# Patient Record
Sex: Male | Born: 1935 | Race: White | Hispanic: No | Smoking: Former smoker
Health system: Southern US, Community
[De-identification: ages and names within clinical notes are randomized; demographics above are authoritative.]

## PROBLEM LIST (undated history)

## (undated) DIAGNOSIS — C8590 Non-Hodgkin lymphoma, unspecified, unspecified site: Secondary | ICD-10-CM

## (undated) DIAGNOSIS — N400 Enlarged prostate without lower urinary tract symptoms: Secondary | ICD-10-CM

## (undated) DIAGNOSIS — M1990 Unspecified osteoarthritis, unspecified site: Secondary | ICD-10-CM

## (undated) DIAGNOSIS — H9190 Unspecified hearing loss, unspecified ear: Secondary | ICD-10-CM

## (undated) DIAGNOSIS — E785 Hyperlipidemia, unspecified: Secondary | ICD-10-CM

## (undated) DIAGNOSIS — G2581 Restless legs syndrome: Secondary | ICD-10-CM

## (undated) HISTORY — PX: EYE SURGERY: SHX253

## (undated) HISTORY — DX: Unspecified osteoarthritis, unspecified site: M19.90

## (undated) HISTORY — DX: Restless legs syndrome: G25.81

## (undated) HISTORY — DX: Non-Hodgkin lymphoma, unspecified, unspecified site: C85.90

## (undated) HISTORY — DX: Hyperlipidemia, unspecified: E78.5

---

## 1961-11-12 HISTORY — PX: HEMORRHOID SURGERY: SHX153

## 2000-11-12 HISTORY — PX: TRIGGER FINGER RELEASE: SHX641

## 2001-11-12 HISTORY — PX: ELBOW SURGERY: SHX618

## 2003-02-09 ENCOUNTER — Ambulatory Visit (HOSPITAL_COMMUNITY): Admission: RE | Admit: 2003-02-09 | Discharge: 2003-02-09 | Payer: Self-pay | Admitting: Pulmonary Disease

## 2003-07-15 ENCOUNTER — Ambulatory Visit (HOSPITAL_COMMUNITY): Admission: RE | Admit: 2003-07-15 | Discharge: 2003-07-15 | Payer: Self-pay | Admitting: Internal Medicine

## 2005-12-12 ENCOUNTER — Other Ambulatory Visit: Admission: RE | Admit: 2005-12-12 | Discharge: 2005-12-12 | Payer: Self-pay | Admitting: Orthopaedic Surgery

## 2006-09-13 ENCOUNTER — Ambulatory Visit: Payer: Self-pay | Admitting: Internal Medicine

## 2006-09-26 ENCOUNTER — Encounter (INDEPENDENT_AMBULATORY_CARE_PROVIDER_SITE_OTHER): Payer: Self-pay | Admitting: *Deleted

## 2006-09-26 ENCOUNTER — Ambulatory Visit: Payer: Self-pay | Admitting: Internal Medicine

## 2009-12-21 ENCOUNTER — Ambulatory Visit (HOSPITAL_COMMUNITY): Admission: RE | Admit: 2009-12-21 | Discharge: 2009-12-21 | Payer: Self-pay | Admitting: Pulmonary Disease

## 2009-12-23 ENCOUNTER — Ambulatory Visit (HOSPITAL_COMMUNITY): Admission: RE | Admit: 2009-12-23 | Discharge: 2009-12-23 | Payer: Self-pay | Admitting: Pulmonary Disease

## 2010-03-29 ENCOUNTER — Ambulatory Visit: Admission: RE | Admit: 2010-03-29 | Discharge: 2010-06-27 | Payer: Self-pay | Admitting: Radiation Oncology

## 2010-11-12 DIAGNOSIS — C8590 Non-Hodgkin lymphoma, unspecified, unspecified site: Secondary | ICD-10-CM

## 2010-11-12 HISTORY — DX: Non-Hodgkin lymphoma, unspecified, unspecified site: C85.90

## 2011-03-30 NOTE — Op Note (Signed)
NAME:  Arthur Walls, Arthur Walls                              ACCOUNT NO.:  [id number]   MEDICAL RECORD NO.:  [id number]                   PATIENT TYPE:  AMB   LOCATION:  DAY                                  FACILITY:  [facility]   PHYSICIAN:  Lionel December, M.D.                 DATE OF BIRTH:  January 04, 1934   DATE OF PROCEDURE:  07/15/2003  DATE OF DISCHARGE:                                 OPERATIVE REPORT   PROCEDURE:  Esophagogastroduodenoscopy with esophageal dilatation.   ENDOSCOPIST:  Lionel December, M.D.   INDICATIONS:  This patient is Walls 77 year old male with chronic GERD who has  intermittent solid food dysphagia.  He is presently on antireflux measures  and Aciphex and his heartburn is well controlled.  He is undergoing  diagnostic and therapeutic procedure.  The procedure and risks were reviewed  with the patient and informed consent was obtained.   PREOPERATIVE MEDICATIONS:  Demerol 50 mg IV and Versed 5 mg IV, and  Cetacaine spray for oropharyngeal topical anesthesia.   FINDINGS:  Procedure performed in endoscopy suite.  The patient's vital  signs and O2 saturation were monitored during the procedure and remained  stable.  The patient was placed in the left lateral recumbent position and  Olympus videoscope was passed via the oropharynx without any difficulty into  the esophagus.   ESOPHAGUS:  Mucosa of the esophagus was normal throughout.  Squamocolumnar  junction was unremarkable.  Ther was [name].   STOMACH:  It was empty and distended very well with insufflation.  The folds  of the proximal stomach were normal.  Examination of the mucosa at gastric  body, antrum, pyloric channel, as well as angularis, fundus, and cardia were  normal.   DUODENUM:  Examination of the bulb and second part of the duodenum was  normal. Endoscope was withdrawn.   The esophagus was dilated by passing [name] through the  esophagus without any difficulty.  The  esophagus was reexamined post  dilatation and there was no mucosal injury noted to the esophagus.   The endoscope was withdrawn.  The patient tolerated the procedure well.   FINAL DIAGNOSES:  1. Small sliding hiatal hernia otherwise normal esophagogastroduodenoscopy.  2. Esophagus dilated by passing the 56 [place] Maloney dilator given history     of dysphagia.   RECOMMENDATIONS:  1. He will continue antireflux measures and Aciphex as before.  2. He is to call our office with Walls progress report next week.   She will call with progress report in 2 weeks if she does not feel better  with therapy, will stop her Prevacid and bring her back for esophageal  manometry and Walls pH study.  Lionel December, M.D.    NR/MEDQ  D:  07/15/2003  T:  07/16/2003  Job:  [id number]   cc:   Ramon Dredge L. Juanetta Gosling, M.D.  [address]  [place]  [place] [address]  Fax: [phone]

## 2011-09-20 ENCOUNTER — Encounter: Payer: Self-pay | Admitting: Internal Medicine

## 2011-11-15 DIAGNOSIS — E785 Hyperlipidemia, unspecified: Secondary | ICD-10-CM | POA: Diagnosis not present

## 2011-11-15 DIAGNOSIS — Z79899 Other long term (current) drug therapy: Secondary | ICD-10-CM | POA: Diagnosis not present

## 2011-11-22 DIAGNOSIS — Z923 Personal history of irradiation: Secondary | ICD-10-CM | POA: Diagnosis not present

## 2011-11-22 DIAGNOSIS — D696 Thrombocytopenia, unspecified: Secondary | ICD-10-CM | POA: Diagnosis not present

## 2011-11-22 DIAGNOSIS — D72820 Lymphocytosis (symptomatic): Secondary | ICD-10-CM | POA: Diagnosis not present

## 2011-11-22 DIAGNOSIS — Z79899 Other long term (current) drug therapy: Secondary | ICD-10-CM | POA: Diagnosis not present

## 2011-11-22 DIAGNOSIS — C8299 Follicular lymphoma, unspecified, extranodal and solid organ sites: Secondary | ICD-10-CM | POA: Diagnosis not present

## 2011-11-22 DIAGNOSIS — B029 Zoster without complications: Secondary | ICD-10-CM | POA: Diagnosis not present

## 2011-11-29 ENCOUNTER — Other Ambulatory Visit (HOSPITAL_COMMUNITY): Payer: Self-pay | Admitting: Internal Medicine

## 2011-11-29 DIAGNOSIS — Z79899 Other long term (current) drug therapy: Secondary | ICD-10-CM | POA: Diagnosis not present

## 2011-11-29 DIAGNOSIS — D72820 Lymphocytosis (symptomatic): Secondary | ICD-10-CM | POA: Diagnosis not present

## 2011-11-29 DIAGNOSIS — B029 Zoster without complications: Secondary | ICD-10-CM | POA: Diagnosis not present

## 2011-11-29 DIAGNOSIS — C8590 Non-Hodgkin lymphoma, unspecified, unspecified site: Secondary | ICD-10-CM

## 2011-11-29 DIAGNOSIS — C8299 Follicular lymphoma, unspecified, extranodal and solid organ sites: Secondary | ICD-10-CM | POA: Diagnosis not present

## 2011-11-29 DIAGNOSIS — D696 Thrombocytopenia, unspecified: Secondary | ICD-10-CM | POA: Diagnosis not present

## 2011-11-29 DIAGNOSIS — Z923 Personal history of irradiation: Secondary | ICD-10-CM | POA: Diagnosis not present

## 2011-12-10 DIAGNOSIS — R972 Elevated prostate specific antigen [PSA]: Secondary | ICD-10-CM | POA: Diagnosis not present

## 2011-12-12 ENCOUNTER — Encounter (HOSPITAL_COMMUNITY)
Admission: RE | Admit: 2011-12-12 | Discharge: 2011-12-12 | Disposition: A | Discharge status: Home / Self Care | Payer: Medicare Other | Source: Ambulatory Visit | Attending: Internal Medicine | Admitting: Internal Medicine

## 2011-12-12 ENCOUNTER — Encounter: Payer: Self-pay | Admitting: Internal Medicine

## 2011-12-12 DIAGNOSIS — C8589 Other specified types of non-Hodgkin lymphoma, extranodal and solid organ sites: Secondary | ICD-10-CM | POA: Insufficient documentation

## 2011-12-12 DIAGNOSIS — I2510 Atherosclerotic heart disease of native coronary artery without angina pectoris: Secondary | ICD-10-CM | POA: Diagnosis not present

## 2011-12-12 DIAGNOSIS — C8590 Non-Hodgkin lymphoma, unspecified, unspecified site: Secondary | ICD-10-CM

## 2011-12-12 LAB — GLUCOSE, CAPILLARY: Glucose-Capillary: 96 mg/dL (ref 70–99)

## 2011-12-12 MED ORDER — FLUDEOXYGLUCOSE F - 18 (FDG) INJECTION
18.0000 | Freq: Once | INTRAVENOUS | Status: AC | PRN
  Administered 2011-12-12: 18 via INTRAVENOUS

## 2011-12-12 NOTE — Telephone Encounter (Signed)
Error

## 2011-12-18 DIAGNOSIS — C8291 Follicular lymphoma, unspecified, lymph nodes of head, face, and neck: Secondary | ICD-10-CM | POA: Diagnosis not present

## 2011-12-18 DIAGNOSIS — Z79899 Other long term (current) drug therapy: Secondary | ICD-10-CM | POA: Diagnosis not present

## 2011-12-19 ENCOUNTER — Telehealth: Payer: Self-pay | Admitting: Internal Medicine

## 2011-12-20 ENCOUNTER — Encounter: Payer: Self-pay | Admitting: Internal Medicine

## 2011-12-20 NOTE — Telephone Encounter (Signed)
Pt is calling wanting to know if his insurance company will cover him having a colon. Spoke with Dwan Bolt and she is calling the pt to speak with him regarding coverage.

## 2011-12-20 NOTE — Telephone Encounter (Signed)
Coverage discussed with pt and appts scheduled for previsit and colon.

## 2012-01-24 ENCOUNTER — Ambulatory Visit (AMBULATORY_SURGERY_CENTER): Payer: Medicare Other | Admitting: *Deleted

## 2012-01-24 VITALS — Ht 66.0 in | Wt 152.6 lb

## 2012-01-24 DIAGNOSIS — Z1211 Encounter for screening for malignant neoplasm of colon: Secondary | ICD-10-CM

## 2012-01-24 MED ORDER — PEG-KCL-NACL-NASULF-NA ASC-C 100 G PO SOLR: ORAL | Status: DC

## 2012-02-07 ENCOUNTER — Encounter: Payer: No Typology Code available for payment source | Admitting: Internal Medicine

## 2012-02-13 DIAGNOSIS — L259 Unspecified contact dermatitis, unspecified cause: Secondary | ICD-10-CM | POA: Diagnosis not present

## 2012-02-13 DIAGNOSIS — D235 Other benign neoplasm of skin of trunk: Secondary | ICD-10-CM | POA: Diagnosis not present

## 2012-03-19 ENCOUNTER — Encounter: Payer: Self-pay | Admitting: Internal Medicine

## 2012-03-19 ENCOUNTER — Ambulatory Visit (AMBULATORY_SURGERY_CENTER): Payer: Medicare Other | Admitting: Internal Medicine

## 2012-03-19 VITALS — BP 128/84 | HR 60 | Temp 97.9°F | Resp 16 | Ht 66.0 in | Wt 152.0 lb

## 2012-03-19 DIAGNOSIS — Z86010 Personal history of colonic polyps: Secondary | ICD-10-CM | POA: Diagnosis not present

## 2012-03-19 DIAGNOSIS — Z1211 Encounter for screening for malignant neoplasm of colon: Secondary | ICD-10-CM

## 2012-03-19 DIAGNOSIS — E785 Hyperlipidemia, unspecified: Secondary | ICD-10-CM | POA: Diagnosis not present

## 2012-03-19 DIAGNOSIS — D126 Benign neoplasm of colon, unspecified: Secondary | ICD-10-CM

## 2012-03-19 MED ORDER — SODIUM CHLORIDE 0.9 % IV SOLN: 500.0000 mL | INTRAVENOUS | Status: DC

## 2012-03-19 NOTE — Progress Notes (Signed)
Patient did not have preoperative order for IV antibiotic SSI prophylaxis. (G8918)  Patient did not experience any of the following events: a burn prior to discharge; a fall within the facility; wrong site/side/patient/procedure/implant event; or a hospital transfer or hospital admission upon discharge from the facility. (G8907)  

## 2012-03-19 NOTE — Patient Instructions (Signed)
YOU HAD AN ENDOSCOPIC PROCEDURE TODAY AT THE [facility]: Refer to the procedure report that was given to you for any specific questions about what was found during the examination.  If the procedure report does not answer your questions, please call your gastroenterologist to clarify.  If you requested that your care partner not be given the details of your procedure findings, then the procedure report has been included in a sealed envelope for you to review at your convenience later.  YOU SHOULD EXPECT: Some feelings of bloating in the abdomen. Passage of more gas than usual.  Walking can help get rid of the air that was put into your GI tract during the procedure and reduce the bloating. If you had a lower endoscopy (such as a colonoscopy or flexible sigmoidoscopy) you may notice spotting of blood in your stool or on the toilet paper. If you underwent a bowel prep for your procedure, then you may not have a normal bowel movement for a few days.  DIET: Your first meal following the procedure should be a light meal and then it is ok to progress to your normal diet.  A half-sandwich or bowl of soup is an example of a good first meal.  Heavy or fried foods are harder to digest and may make you feel nauseous or bloated.  Likewise meals heavy in dairy and vegetables can cause extra gas to form and this can also increase the bloating.  Drink plenty of fluids but you should avoid alcoholic beverages for 24 hours.  ACTIVITY: Your care partner should take you home directly after the procedure.  You should plan to take it easy, moving slowly for the rest of the day.  You can resume normal activity the day after the procedure however you should NOT DRIVE or use heavy machinery for 24 hours (because of the sedation medicines used during the test).    SYMPTOMS TO REPORT IMMEDIATELY: A gastroenterologist can be reached at any hour.  During normal business hours, 8:30 AM to 5:00 PM Monday through Friday,  call [phone].  After hours and on weekends, please call the GI answering service at [phone] who will take a message and have the physician on call contact you.   Following lower endoscopy (colonoscopy or flexible sigmoidoscopy):  Excessive amounts of blood in the stool  Significant tenderness or worsening of abdominal pains  Swelling of the abdomen that is new, acute  Fever of 100F or higher    FOLLOW UP: If any biopsies were taken you will be contacted by phone or by letter within the next 1-3 weeks.  Call your gastroenterologist if you have not heard about the biopsies in 3 weeks.  Our staff will call the home number listed on your records the next business day following your procedure to check on you and address any questions or concerns that you may have at that time regarding the information given to you following your procedure. This is a courtesy call and so if there is no answer at the home number and we have not heard from you through the emergency physician on call, we will assume that you have returned to your regular daily activities without incident.  SIGNATURES/CONFIDENTIALITY: You and/or your care partner have signed paperwork which will be entered into your electronic medical record.  These signatures attest to the fact that that the information above on your After Visit Summary has been reviewed and is understood.  Full responsibility of the confidentiality   of this discharge information lies with you and/or your care-partner.     

## 2012-03-19 NOTE — Op Note (Signed)
 [facility] [address] [place], [place]  [address]  COLONOSCOPY PROCEDURE REPORT  PATIENT:  Arthur Walls, Arthur Walls  MR#:  [id number] BIRTHDATE:  [birth year], 78 yrs. old  GENDER:  male ENDOSCOPIST:  Wilhemina Bonito. Eda Keys, MD REF. BY:  Surveillance Program Recall, PROCEDURE DATE:  03/19/2012 PROCEDURE:  Colonoscopy with snare polypectomy x 1 ASA CLASS:  Class III INDICATIONS:  history of pre-cancerous (adenomatous) colon polyps, surveillance and high-risk screening ; index 2003 w/ large VA; f/u 2004, 2007 MEDICATIONS:   MAC sedation, administered by CRNA, propofol (Diprivan) 180 mg IV  DESCRIPTION OF PROCEDURE:   After the risks benefits and alternatives of the procedure were thoroughly explained, informed consent was obtained.  Digital rectal exam was performed and revealed no abnormalities.   The LB CF-[id number] endoscope was introduced through the anus and advanced to the cecum, which was identified by both the appendix and ileocecal valve, without limitations.  The quality of the prep was good, using MoviPrep. The instrument was then slowly withdrawn as the colon was fully examined. <<PROCEDUREIMAGES>>  FINDINGS:  A diminutive polyp was found in the sigmoid colon and snared without cautery. Retrieval was successful.  Mild diverticulosis was found in the sigmoid colon.  Otherwise normal colonoscopy without other polyps, masses, vascular ectasias, or inflammatory changes.   Retroflexed views in the rectum revealed internal hemorrhoids.    The time to cecum = 3:33  minutes. The scope was then withdrawn in  13:40  minutes from the cecum and the procedure completed.  COMPLICATIONS:  None  ENDOSCOPIC IMPRESSION: 1) Diminutive polyp in the sigmoid colon - removed 2) Mild diverticulosis in the sigmoid colon 3) Otherwise normal colonoscopy 4) Internal hemorrhoids  RECOMMENDATIONS: 1) Return to the care of your primary provider. GI follow up  as needed  ______________________________ Wilhemina Bonito. Eda Keys, MD  CC:  Shaune Pollack, MD;  The Patient  n. eSIGNED:   Wilhemina Bonito. Eda Keys at 03/19/2012 02:05 PM  Dellia Nims, [phone]

## 2012-03-20 ENCOUNTER — Telehealth: Payer: Self-pay | Admitting: *Deleted

## 2012-03-20 NOTE — Telephone Encounter (Signed)
  Follow up Call-  Call back number 03/19/2012  Post procedure Call Back phone  # [phone]  Permission to leave phone message Yes     Patient questions:  Do you have a fever, pain , or abdominal swelling? no Pain Score  0 *  Have you tolerated food without any problems? yes  Have you been able to return to your normal activities? yes  Do you have any questions about your discharge instructions: Diet   no Medications  no Follow up visit  no  Do you have questions or concerns about your Care? no  Actions: * If pain score is 4 or above: No action needed, pain <4.

## 2012-03-25 ENCOUNTER — Encounter: Payer: Self-pay | Admitting: Internal Medicine

## 2012-04-17 DIAGNOSIS — M25549 Pain in joints of unspecified hand: Secondary | ICD-10-CM | POA: Diagnosis not present

## 2012-05-01 DIAGNOSIS — S63639A Sprain of interphalangeal joint of unspecified finger, initial encounter: Secondary | ICD-10-CM | POA: Diagnosis not present

## 2012-05-01 DIAGNOSIS — M25549 Pain in joints of unspecified hand: Secondary | ICD-10-CM | POA: Diagnosis not present

## 2012-06-09 ENCOUNTER — Encounter: Payer: Medicare Other | Admitting: Internal Medicine

## 2012-06-09 DIAGNOSIS — C8589 Other specified types of non-Hodgkin lymphoma, extranodal and solid organ sites: Secondary | ICD-10-CM

## 2012-06-12 DIAGNOSIS — M6530 Trigger finger, unspecified finger: Secondary | ICD-10-CM | POA: Diagnosis not present

## 2012-06-30 DIAGNOSIS — N320 Bladder-neck obstruction: Secondary | ICD-10-CM | POA: Diagnosis not present

## 2012-06-30 DIAGNOSIS — N400 Enlarged prostate without lower urinary tract symptoms: Secondary | ICD-10-CM | POA: Diagnosis not present

## 2012-06-30 DIAGNOSIS — R972 Elevated prostate specific antigen [PSA]: Secondary | ICD-10-CM | POA: Diagnosis not present

## 2012-07-02 DIAGNOSIS — L570 Actinic keratosis: Secondary | ICD-10-CM | POA: Diagnosis not present

## 2012-07-02 DIAGNOSIS — R209 Unspecified disturbances of skin sensation: Secondary | ICD-10-CM | POA: Diagnosis not present

## 2012-07-08 DIAGNOSIS — M6530 Trigger finger, unspecified finger: Secondary | ICD-10-CM | POA: Diagnosis not present

## 2012-07-23 DIAGNOSIS — M6530 Trigger finger, unspecified finger: Secondary | ICD-10-CM | POA: Diagnosis not present

## 2012-08-18 DIAGNOSIS — M25549 Pain in joints of unspecified hand: Secondary | ICD-10-CM | POA: Diagnosis not present

## 2012-08-18 DIAGNOSIS — Z23 Encounter for immunization: Secondary | ICD-10-CM | POA: Diagnosis not present

## 2012-09-08 DIAGNOSIS — M25549 Pain in joints of unspecified hand: Secondary | ICD-10-CM | POA: Diagnosis not present

## 2012-09-16 DIAGNOSIS — M6530 Trigger finger, unspecified finger: Secondary | ICD-10-CM | POA: Diagnosis not present

## 2012-09-16 DIAGNOSIS — M79609 Pain in unspecified limb: Secondary | ICD-10-CM | POA: Diagnosis not present

## 2012-09-16 DIAGNOSIS — M25649 Stiffness of unspecified hand, not elsewhere classified: Secondary | ICD-10-CM | POA: Diagnosis not present

## 2012-09-23 DIAGNOSIS — Z79899 Other long term (current) drug therapy: Secondary | ICD-10-CM | POA: Diagnosis not present

## 2012-09-23 DIAGNOSIS — G2581 Restless legs syndrome: Secondary | ICD-10-CM | POA: Diagnosis not present

## 2012-09-23 DIAGNOSIS — Z0000 Encounter for general adult medical examination without abnormal findings: Secondary | ICD-10-CM | POA: Diagnosis not present

## 2012-09-23 DIAGNOSIS — Z125 Encounter for screening for malignant neoplasm of prostate: Secondary | ICD-10-CM | POA: Diagnosis not present

## 2012-09-23 DIAGNOSIS — M25549 Pain in joints of unspecified hand: Secondary | ICD-10-CM | POA: Diagnosis not present

## 2012-09-23 DIAGNOSIS — E785 Hyperlipidemia, unspecified: Secondary | ICD-10-CM | POA: Diagnosis not present

## 2012-09-25 DIAGNOSIS — M6530 Trigger finger, unspecified finger: Secondary | ICD-10-CM | POA: Diagnosis not present

## 2012-09-25 DIAGNOSIS — M79609 Pain in unspecified limb: Secondary | ICD-10-CM | POA: Diagnosis not present

## 2012-09-25 DIAGNOSIS — M25649 Stiffness of unspecified hand, not elsewhere classified: Secondary | ICD-10-CM | POA: Diagnosis not present

## 2012-09-30 DIAGNOSIS — M25649 Stiffness of unspecified hand, not elsewhere classified: Secondary | ICD-10-CM | POA: Diagnosis not present

## 2012-09-30 DIAGNOSIS — M79609 Pain in unspecified limb: Secondary | ICD-10-CM | POA: Diagnosis not present

## 2012-09-30 DIAGNOSIS — M6530 Trigger finger, unspecified finger: Secondary | ICD-10-CM | POA: Diagnosis not present

## 2012-10-02 DIAGNOSIS — M79609 Pain in unspecified limb: Secondary | ICD-10-CM | POA: Diagnosis not present

## 2012-10-02 DIAGNOSIS — M25649 Stiffness of unspecified hand, not elsewhere classified: Secondary | ICD-10-CM | POA: Diagnosis not present

## 2012-10-02 DIAGNOSIS — M6530 Trigger finger, unspecified finger: Secondary | ICD-10-CM | POA: Diagnosis not present

## 2012-10-06 DIAGNOSIS — M6530 Trigger finger, unspecified finger: Secondary | ICD-10-CM | POA: Diagnosis not present

## 2012-10-06 DIAGNOSIS — M25649 Stiffness of unspecified hand, not elsewhere classified: Secondary | ICD-10-CM | POA: Diagnosis not present

## 2012-10-06 DIAGNOSIS — M79609 Pain in unspecified limb: Secondary | ICD-10-CM | POA: Diagnosis not present

## 2012-10-08 DIAGNOSIS — M25649 Stiffness of unspecified hand, not elsewhere classified: Secondary | ICD-10-CM | POA: Diagnosis not present

## 2012-10-08 DIAGNOSIS — M79609 Pain in unspecified limb: Secondary | ICD-10-CM | POA: Diagnosis not present

## 2012-10-08 DIAGNOSIS — M6530 Trigger finger, unspecified finger: Secondary | ICD-10-CM | POA: Diagnosis not present

## 2012-10-14 DIAGNOSIS — M25649 Stiffness of unspecified hand, not elsewhere classified: Secondary | ICD-10-CM | POA: Diagnosis not present

## 2012-10-14 DIAGNOSIS — M79609 Pain in unspecified limb: Secondary | ICD-10-CM | POA: Diagnosis not present

## 2012-10-14 DIAGNOSIS — M6530 Trigger finger, unspecified finger: Secondary | ICD-10-CM | POA: Diagnosis not present

## 2012-10-16 DIAGNOSIS — M79609 Pain in unspecified limb: Secondary | ICD-10-CM | POA: Diagnosis not present

## 2012-10-16 DIAGNOSIS — M6530 Trigger finger, unspecified finger: Secondary | ICD-10-CM | POA: Diagnosis not present

## 2012-10-16 DIAGNOSIS — M25649 Stiffness of unspecified hand, not elsewhere classified: Secondary | ICD-10-CM | POA: Diagnosis not present

## 2012-10-21 DIAGNOSIS — G5600 Carpal tunnel syndrome, unspecified upper limb: Secondary | ICD-10-CM | POA: Diagnosis not present

## 2012-10-21 DIAGNOSIS — M25549 Pain in joints of unspecified hand: Secondary | ICD-10-CM | POA: Diagnosis not present

## 2012-10-21 DIAGNOSIS — M6530 Trigger finger, unspecified finger: Secondary | ICD-10-CM | POA: Diagnosis not present

## 2012-10-21 DIAGNOSIS — M25649 Stiffness of unspecified hand, not elsewhere classified: Secondary | ICD-10-CM | POA: Diagnosis not present

## 2012-10-21 DIAGNOSIS — M79609 Pain in unspecified limb: Secondary | ICD-10-CM | POA: Diagnosis not present

## 2012-11-13 DIAGNOSIS — M25549 Pain in joints of unspecified hand: Secondary | ICD-10-CM | POA: Diagnosis not present

## 2012-12-01 ENCOUNTER — Encounter: Payer: Medicare Other | Admitting: Internal Medicine

## 2012-12-01 DIAGNOSIS — C8299 Follicular lymphoma, unspecified, extranodal and solid organ sites: Secondary | ICD-10-CM | POA: Diagnosis not present

## 2012-12-01 DIAGNOSIS — C8589 Other specified types of non-Hodgkin lymphoma, extranodal and solid organ sites: Secondary | ICD-10-CM | POA: Diagnosis not present

## 2013-01-05 DIAGNOSIS — N400 Enlarged prostate without lower urinary tract symptoms: Secondary | ICD-10-CM | POA: Diagnosis not present

## 2013-01-05 DIAGNOSIS — R972 Elevated prostate specific antigen [PSA]: Secondary | ICD-10-CM | POA: Diagnosis not present

## 2013-04-20 DIAGNOSIS — M545 Low back pain: Secondary | ICD-10-CM | POA: Diagnosis not present

## 2013-04-20 DIAGNOSIS — N400 Enlarged prostate without lower urinary tract symptoms: Secondary | ICD-10-CM | POA: Diagnosis not present

## 2013-04-20 DIAGNOSIS — C8589 Other specified types of non-Hodgkin lymphoma, extranodal and solid organ sites: Secondary | ICD-10-CM | POA: Diagnosis not present

## 2013-04-20 DIAGNOSIS — H1030 Unspecified acute conjunctivitis, unspecified eye: Secondary | ICD-10-CM | POA: Diagnosis not present

## 2013-04-23 DIAGNOSIS — M999 Biomechanical lesion, unspecified: Secondary | ICD-10-CM | POA: Diagnosis not present

## 2013-04-23 DIAGNOSIS — M5137 Other intervertebral disc degeneration, lumbosacral region: Secondary | ICD-10-CM | POA: Diagnosis not present

## 2013-04-29 DIAGNOSIS — M5137 Other intervertebral disc degeneration, lumbosacral region: Secondary | ICD-10-CM | POA: Diagnosis not present

## 2013-04-29 DIAGNOSIS — M999 Biomechanical lesion, unspecified: Secondary | ICD-10-CM | POA: Diagnosis not present

## 2013-04-30 DIAGNOSIS — M5137 Other intervertebral disc degeneration, lumbosacral region: Secondary | ICD-10-CM | POA: Diagnosis not present

## 2013-04-30 DIAGNOSIS — M999 Biomechanical lesion, unspecified: Secondary | ICD-10-CM | POA: Diagnosis not present

## 2013-05-04 DIAGNOSIS — M5137 Other intervertebral disc degeneration, lumbosacral region: Secondary | ICD-10-CM | POA: Diagnosis not present

## 2013-05-04 DIAGNOSIS — M999 Biomechanical lesion, unspecified: Secondary | ICD-10-CM | POA: Diagnosis not present

## 2013-05-06 DIAGNOSIS — M5137 Other intervertebral disc degeneration, lumbosacral region: Secondary | ICD-10-CM | POA: Diagnosis not present

## 2013-05-06 DIAGNOSIS — M999 Biomechanical lesion, unspecified: Secondary | ICD-10-CM | POA: Diagnosis not present

## 2013-05-07 DIAGNOSIS — M5137 Other intervertebral disc degeneration, lumbosacral region: Secondary | ICD-10-CM | POA: Diagnosis not present

## 2013-05-07 DIAGNOSIS — M999 Biomechanical lesion, unspecified: Secondary | ICD-10-CM | POA: Diagnosis not present

## 2013-05-11 DIAGNOSIS — M5137 Other intervertebral disc degeneration, lumbosacral region: Secondary | ICD-10-CM | POA: Diagnosis not present

## 2013-05-11 DIAGNOSIS — M999 Biomechanical lesion, unspecified: Secondary | ICD-10-CM | POA: Diagnosis not present

## 2013-05-12 DIAGNOSIS — M5137 Other intervertebral disc degeneration, lumbosacral region: Secondary | ICD-10-CM | POA: Diagnosis not present

## 2013-05-12 DIAGNOSIS — M999 Biomechanical lesion, unspecified: Secondary | ICD-10-CM | POA: Diagnosis not present

## 2013-05-18 DIAGNOSIS — M5137 Other intervertebral disc degeneration, lumbosacral region: Secondary | ICD-10-CM | POA: Diagnosis not present

## 2013-05-18 DIAGNOSIS — M999 Biomechanical lesion, unspecified: Secondary | ICD-10-CM | POA: Diagnosis not present

## 2013-05-20 DIAGNOSIS — M5137 Other intervertebral disc degeneration, lumbosacral region: Secondary | ICD-10-CM | POA: Diagnosis not present

## 2013-05-20 DIAGNOSIS — M999 Biomechanical lesion, unspecified: Secondary | ICD-10-CM | POA: Diagnosis not present

## 2013-05-21 DIAGNOSIS — M5137 Other intervertebral disc degeneration, lumbosacral region: Secondary | ICD-10-CM | POA: Diagnosis not present

## 2013-05-21 DIAGNOSIS — M999 Biomechanical lesion, unspecified: Secondary | ICD-10-CM | POA: Diagnosis not present

## 2013-05-25 DIAGNOSIS — M5137 Other intervertebral disc degeneration, lumbosacral region: Secondary | ICD-10-CM | POA: Diagnosis not present

## 2013-05-25 DIAGNOSIS — M999 Biomechanical lesion, unspecified: Secondary | ICD-10-CM | POA: Diagnosis not present

## 2013-06-11 DIAGNOSIS — H02059 Trichiasis without entropian unspecified eye, unspecified eyelid: Secondary | ICD-10-CM | POA: Diagnosis not present

## 2013-06-22 DIAGNOSIS — H11429 Conjunctival edema, unspecified eye: Secondary | ICD-10-CM | POA: Diagnosis not present

## 2013-06-29 ENCOUNTER — Ambulatory Visit (HOSPITAL_COMMUNITY)
Admission: RE | Admit: 2013-06-29 | Discharge: 2013-06-29 | Disposition: A | Discharge status: Home / Self Care | Payer: Medicare Other | Source: Ambulatory Visit | Attending: Pulmonary Disease | Admitting: Pulmonary Disease

## 2013-06-29 ENCOUNTER — Other Ambulatory Visit (HOSPITAL_COMMUNITY): Payer: Self-pay | Admitting: Pulmonary Disease

## 2013-06-29 DIAGNOSIS — Z87898 Personal history of other specified conditions: Secondary | ICD-10-CM | POA: Diagnosis not present

## 2013-06-29 DIAGNOSIS — C8589 Other specified types of non-Hodgkin lymphoma, extranodal and solid organ sites: Secondary | ICD-10-CM | POA: Diagnosis not present

## 2013-06-29 DIAGNOSIS — M25559 Pain in unspecified hip: Secondary | ICD-10-CM | POA: Insufficient documentation

## 2013-06-29 DIAGNOSIS — M25551 Pain in right hip: Secondary | ICD-10-CM

## 2013-06-29 DIAGNOSIS — N400 Enlarged prostate without lower urinary tract symptoms: Secondary | ICD-10-CM | POA: Diagnosis not present

## 2013-07-01 ENCOUNTER — Other Ambulatory Visit (HOSPITAL_COMMUNITY): Payer: Self-pay | Admitting: Pulmonary Disease

## 2013-07-01 DIAGNOSIS — M25551 Pain in right hip: Secondary | ICD-10-CM

## 2013-07-03 ENCOUNTER — Ambulatory Visit (HOSPITAL_COMMUNITY)
Admission: RE | Admit: 2013-07-03 | Discharge: 2013-07-03 | Disposition: A | Discharge status: Home / Self Care | Payer: Medicare Other | Source: Ambulatory Visit | Attending: Pulmonary Disease | Admitting: Pulmonary Disease

## 2013-07-03 DIAGNOSIS — M25551 Pain in right hip: Secondary | ICD-10-CM

## 2013-07-03 DIAGNOSIS — C8589 Other specified types of non-Hodgkin lymphoma, extranodal and solid organ sites: Secondary | ICD-10-CM | POA: Insufficient documentation

## 2013-07-03 DIAGNOSIS — M25559 Pain in unspecified hip: Secondary | ICD-10-CM | POA: Insufficient documentation

## 2013-07-20 DIAGNOSIS — N400 Enlarged prostate without lower urinary tract symptoms: Secondary | ICD-10-CM | POA: Diagnosis not present

## 2013-07-20 DIAGNOSIS — R972 Elevated prostate specific antigen [PSA]: Secondary | ICD-10-CM | POA: Diagnosis not present

## 2013-07-20 DIAGNOSIS — N320 Bladder-neck obstruction: Secondary | ICD-10-CM | POA: Diagnosis not present

## 2013-07-23 DIAGNOSIS — L570 Actinic keratosis: Secondary | ICD-10-CM | POA: Diagnosis not present

## 2013-07-23 DIAGNOSIS — D1801 Hemangioma of skin and subcutaneous tissue: Secondary | ICD-10-CM | POA: Diagnosis not present

## 2013-07-23 DIAGNOSIS — L821 Other seborrheic keratosis: Secondary | ICD-10-CM | POA: Diagnosis not present

## 2013-08-03 ENCOUNTER — Other Ambulatory Visit (HOSPITAL_COMMUNITY): Payer: Self-pay | Admitting: Hematology and Oncology

## 2013-08-03 DIAGNOSIS — D696 Thrombocytopenia, unspecified: Secondary | ICD-10-CM | POA: Diagnosis not present

## 2013-08-03 DIAGNOSIS — D751 Secondary polycythemia: Secondary | ICD-10-CM | POA: Diagnosis not present

## 2013-08-03 DIAGNOSIS — C8589 Other specified types of non-Hodgkin lymphoma, extranodal and solid organ sites: Secondary | ICD-10-CM

## 2013-08-03 DIAGNOSIS — C8590 Non-Hodgkin lymphoma, unspecified, unspecified site: Secondary | ICD-10-CM

## 2013-08-14 ENCOUNTER — Encounter (HOSPITAL_COMMUNITY): Payer: Self-pay

## 2013-08-14 ENCOUNTER — Encounter (HOSPITAL_COMMUNITY)
Admission: RE | Admit: 2013-08-14 | Discharge: 2013-08-14 | Disposition: A | Discharge status: Home / Self Care | Payer: Medicare Other | Source: Ambulatory Visit | Attending: Hematology and Oncology | Admitting: Hematology and Oncology

## 2013-08-14 DIAGNOSIS — C8590 Non-Hodgkin lymphoma, unspecified, unspecified site: Secondary | ICD-10-CM

## 2013-08-14 DIAGNOSIS — D751 Secondary polycythemia: Secondary | ICD-10-CM | POA: Insufficient documentation

## 2013-08-14 DIAGNOSIS — C8589 Other specified types of non-Hodgkin lymphoma, extranodal and solid organ sites: Secondary | ICD-10-CM | POA: Diagnosis not present

## 2013-08-14 LAB — GLUCOSE, CAPILLARY: Glucose-Capillary: 100 mg/dL — ABNORMAL HIGH (ref 70–99)

## 2013-08-14 MED ORDER — FLUDEOXYGLUCOSE F - 18 (FDG) INJECTION
17.2000 | Freq: Once | INTRAVENOUS | Status: AC | PRN
  Administered 2013-08-14: 17.2 via INTRAVENOUS

## 2013-08-18 DIAGNOSIS — C8589 Other specified types of non-Hodgkin lymphoma, extranodal and solid organ sites: Secondary | ICD-10-CM | POA: Diagnosis not present

## 2013-08-18 DIAGNOSIS — C8299 Follicular lymphoma, unspecified, extranodal and solid organ sites: Secondary | ICD-10-CM

## 2013-08-18 DIAGNOSIS — N400 Enlarged prostate without lower urinary tract symptoms: Secondary | ICD-10-CM | POA: Diagnosis not present

## 2013-08-18 DIAGNOSIS — D696 Thrombocytopenia, unspecified: Secondary | ICD-10-CM

## 2013-08-18 DIAGNOSIS — D751 Secondary polycythemia: Secondary | ICD-10-CM | POA: Diagnosis not present

## 2013-08-27 DIAGNOSIS — Z23 Encounter for immunization: Secondary | ICD-10-CM | POA: Diagnosis not present

## 2013-09-21 DIAGNOSIS — L905 Scar conditions and fibrosis of skin: Secondary | ICD-10-CM | POA: Diagnosis not present

## 2013-09-23 DIAGNOSIS — Z125 Encounter for screening for malignant neoplasm of prostate: Secondary | ICD-10-CM | POA: Diagnosis not present

## 2013-09-23 DIAGNOSIS — Z79899 Other long term (current) drug therapy: Secondary | ICD-10-CM | POA: Diagnosis not present

## 2013-09-23 DIAGNOSIS — G2581 Restless legs syndrome: Secondary | ICD-10-CM | POA: Diagnosis not present

## 2013-09-23 DIAGNOSIS — Z0000 Encounter for general adult medical examination without abnormal findings: Secondary | ICD-10-CM | POA: Diagnosis not present

## 2013-10-19 DIAGNOSIS — C8589 Other specified types of non-Hodgkin lymphoma, extranodal and solid organ sites: Secondary | ICD-10-CM | POA: Diagnosis not present

## 2013-10-19 DIAGNOSIS — D751 Secondary polycythemia: Secondary | ICD-10-CM | POA: Diagnosis not present

## 2013-10-19 DIAGNOSIS — N400 Enlarged prostate without lower urinary tract symptoms: Secondary | ICD-10-CM | POA: Diagnosis not present

## 2013-10-19 DIAGNOSIS — D696 Thrombocytopenia, unspecified: Secondary | ICD-10-CM | POA: Diagnosis not present

## 2013-12-14 DIAGNOSIS — C8589 Other specified types of non-Hodgkin lymphoma, extranodal and solid organ sites: Secondary | ICD-10-CM | POA: Diagnosis not present

## 2014-01-11 DIAGNOSIS — R972 Elevated prostate specific antigen [PSA]: Secondary | ICD-10-CM | POA: Diagnosis not present

## 2014-01-11 DIAGNOSIS — N320 Bladder-neck obstruction: Secondary | ICD-10-CM | POA: Diagnosis not present

## 2014-01-11 DIAGNOSIS — N400 Enlarged prostate without lower urinary tract symptoms: Secondary | ICD-10-CM | POA: Diagnosis not present

## 2014-02-15 DIAGNOSIS — R972 Elevated prostate specific antigen [PSA]: Secondary | ICD-10-CM | POA: Diagnosis not present

## 2014-02-17 DIAGNOSIS — C8589 Other specified types of non-Hodgkin lymphoma, extranodal and solid organ sites: Secondary | ICD-10-CM | POA: Diagnosis not present

## 2014-07-12 DIAGNOSIS — N403 Nodular prostate with lower urinary tract symptoms: Secondary | ICD-10-CM | POA: Diagnosis not present

## 2014-07-12 DIAGNOSIS — R972 Elevated prostate specific antigen [PSA]: Secondary | ICD-10-CM | POA: Diagnosis not present

## 2014-07-12 DIAGNOSIS — N138 Other obstructive and reflux uropathy: Secondary | ICD-10-CM | POA: Diagnosis not present

## 2014-07-12 DIAGNOSIS — N320 Bladder-neck obstruction: Secondary | ICD-10-CM | POA: Diagnosis not present

## 2014-07-12 DIAGNOSIS — N401 Enlarged prostate with lower urinary tract symptoms: Secondary | ICD-10-CM | POA: Diagnosis not present

## 2014-08-17 DIAGNOSIS — C8590 Non-Hodgkin lymphoma, unspecified, unspecified site: Secondary | ICD-10-CM | POA: Diagnosis not present

## 2014-08-17 DIAGNOSIS — Z23 Encounter for immunization: Secondary | ICD-10-CM | POA: Diagnosis not present

## 2014-08-17 DIAGNOSIS — D696 Thrombocytopenia, unspecified: Secondary | ICD-10-CM | POA: Diagnosis not present

## 2014-10-27 DIAGNOSIS — Z79899 Other long term (current) drug therapy: Secondary | ICD-10-CM | POA: Diagnosis not present

## 2014-10-27 DIAGNOSIS — Z23 Encounter for immunization: Secondary | ICD-10-CM | POA: Diagnosis not present

## 2014-10-27 DIAGNOSIS — C8580 Other specified types of non-Hodgkin lymphoma, unspecified site: Secondary | ICD-10-CM | POA: Diagnosis not present

## 2014-10-27 DIAGNOSIS — N401 Enlarged prostate with lower urinary tract symptoms: Secondary | ICD-10-CM | POA: Diagnosis not present

## 2014-10-27 DIAGNOSIS — L2081 Atopic neurodermatitis: Secondary | ICD-10-CM | POA: Diagnosis not present

## 2014-10-27 DIAGNOSIS — Z0000 Encounter for general adult medical examination without abnormal findings: Secondary | ICD-10-CM | POA: Diagnosis not present

## 2014-11-15 DIAGNOSIS — R972 Elevated prostate specific antigen [PSA]: Secondary | ICD-10-CM | POA: Diagnosis not present

## 2015-01-17 DIAGNOSIS — N401 Enlarged prostate with lower urinary tract symptoms: Secondary | ICD-10-CM | POA: Diagnosis not present

## 2015-01-17 DIAGNOSIS — R972 Elevated prostate specific antigen [PSA]: Secondary | ICD-10-CM | POA: Diagnosis not present

## 2015-01-31 IMAGING — CR DG HIP COMPLETE 2+V*R*
3 series · 3 of 3 positions shown · non-contrast
Comparison: [HOSPITAL] PET CT 12/12/2011.

CLINICAL DATA: 79-year-old male with right hip pain. History of non-
Hodgkins lymphoma.

RIGHT HIP - COMPLETE 2+ VIEW

[view not recorded (1 of 3)]
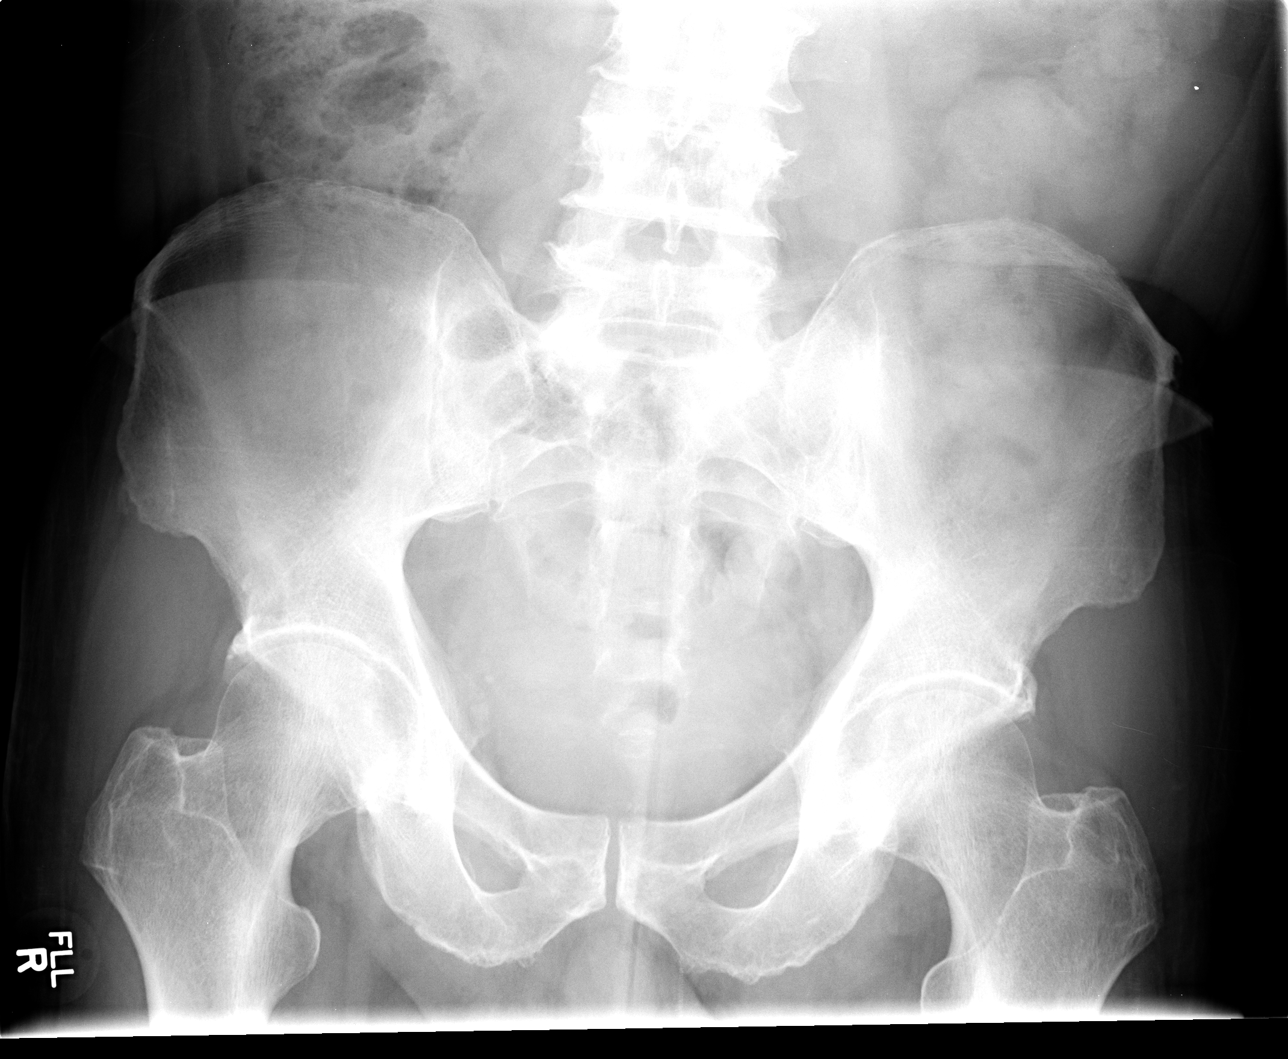

[view not recorded (2 of 3)]
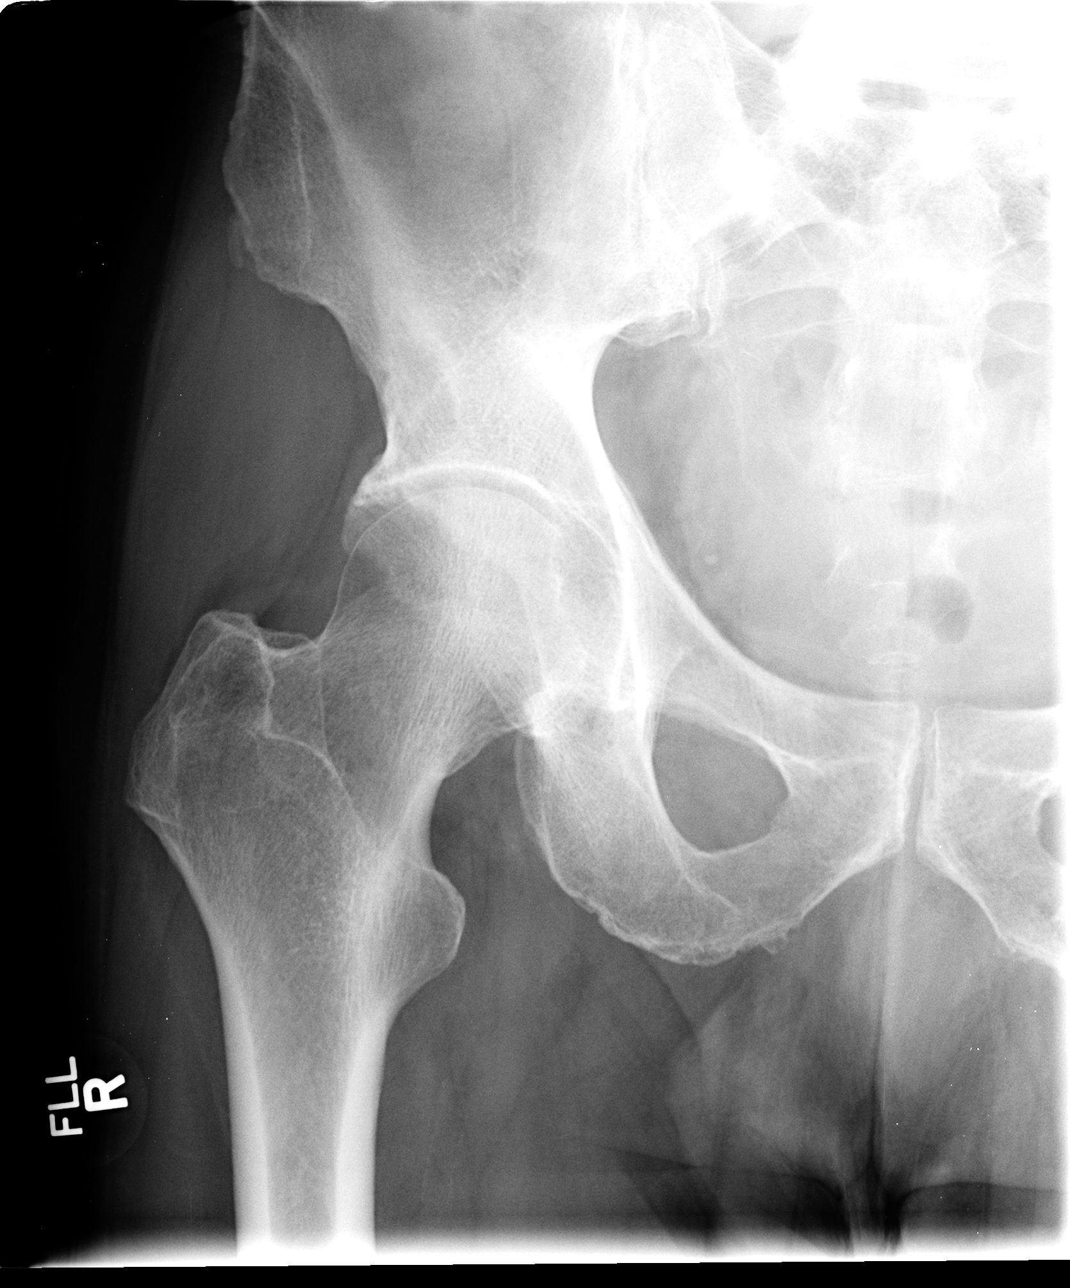

[view not recorded (3 of 3)]
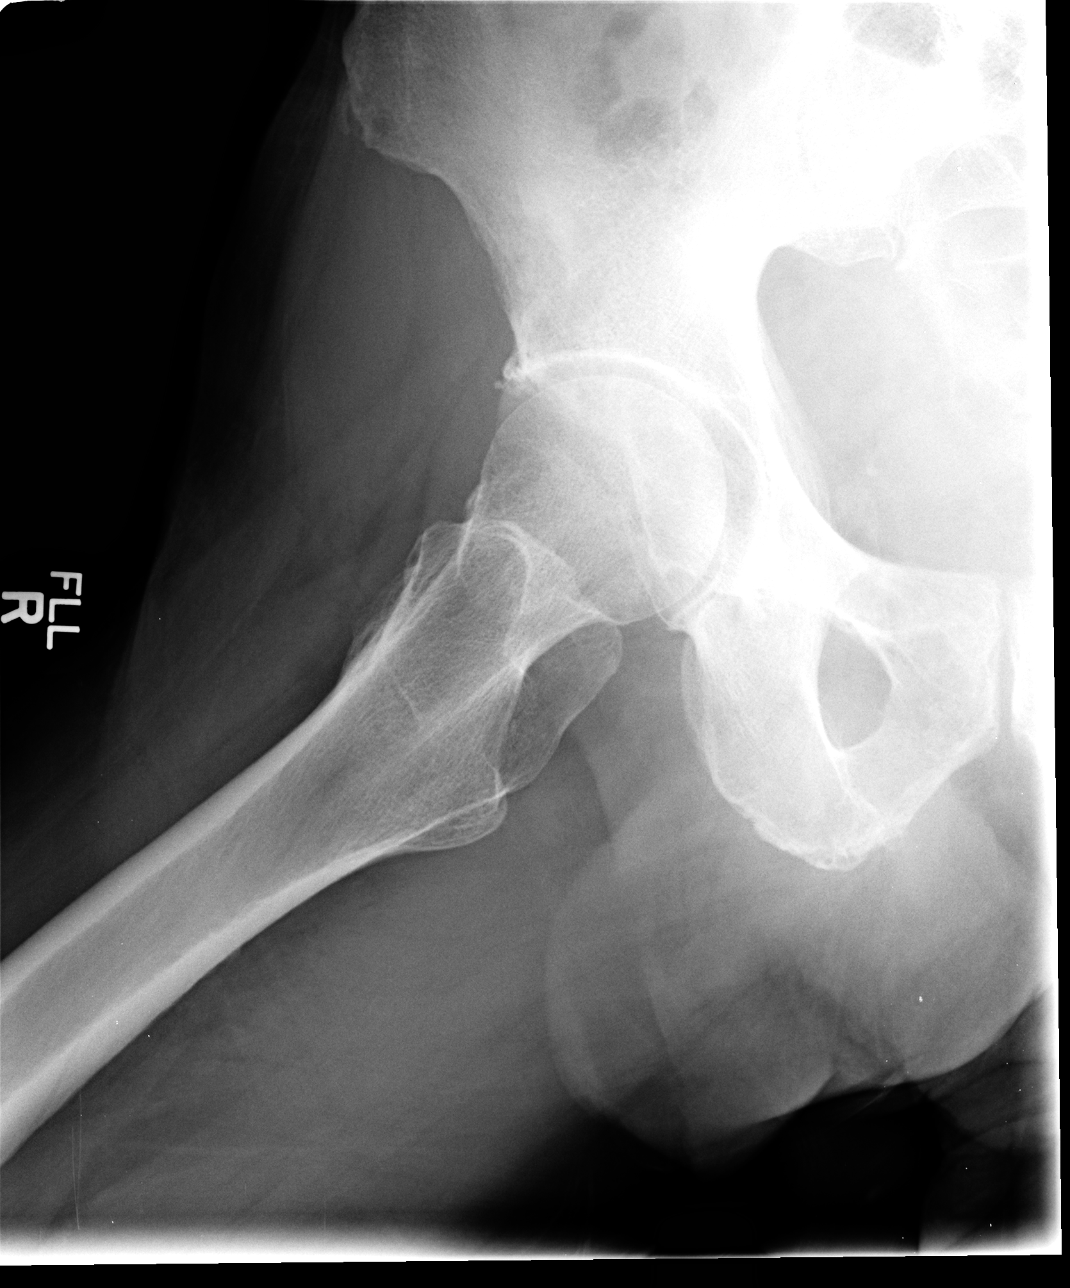

[3 of 3 positions shown; findings below may reference images not displayed]

FINDINGS: Femoral heads normally located.  Joint spaces appear
symmetric and within normal limits for age. Bone mineralization is
within normal limits for age.  Pelvis intact.  Proximal right femur
intact.  Proximal femoral bone mineralization within normal limits.
IMPRESSION: No osseous abnormality identified about the right hip or pelvis.

## 2015-02-15 DIAGNOSIS — D696 Thrombocytopenia, unspecified: Secondary | ICD-10-CM | POA: Diagnosis not present

## 2015-02-15 DIAGNOSIS — M722 Plantar fascial fibromatosis: Secondary | ICD-10-CM | POA: Diagnosis not present

## 2015-02-15 DIAGNOSIS — C8590 Non-Hodgkin lymphoma, unspecified, unspecified site: Secondary | ICD-10-CM | POA: Diagnosis not present

## 2015-02-15 DIAGNOSIS — C8200 Follicular lymphoma grade I, unspecified site: Secondary | ICD-10-CM | POA: Diagnosis not present

## 2015-02-15 DIAGNOSIS — D751 Secondary polycythemia: Secondary | ICD-10-CM | POA: Diagnosis not present

## 2015-03-18 IMAGING — CT NM PET TUM IMG RESTAG (PS) SKULL BASE T - THIGH
6 series · 25 of 25 positions shown · IV contrast ([ID])
Comparison: 12/12/2011

CLINICAL DATA: Subsequent treatment strategy for lymphoma.

EXAM:
NUCLEAR MEDICINE PET SKULL BASE TO THIGH
FASTING BLOOD GLUCOSE:  Value:  100 mg/dl
TECHNIQUE: 17.2 mCi F-18 FDG was injected intravenously. CT data was obtained
and used for attenuation correction and anatomic localization only.
(This was not acquired as a diagnostic CT examination.) Additional
exam technical data entered on technologist worksheet.

[Series 1: pet ac · axial · 3.3mm · 4.69mm/px · z∈[-853,+17]mm · 5 of 267 slices shown]
[im 1/267]
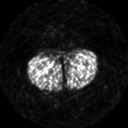
[im 67/267]
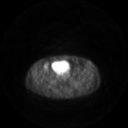
[im 134/267]
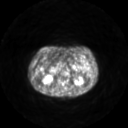
[im 200/267]
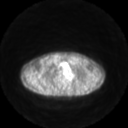
[im 267/267]
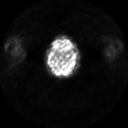

[Series 2: ct images · axial · 3.8mm · 0.98mm/px · z∈[-853,+16]mm · 6 of 267 slices shown]
[im 1/267]
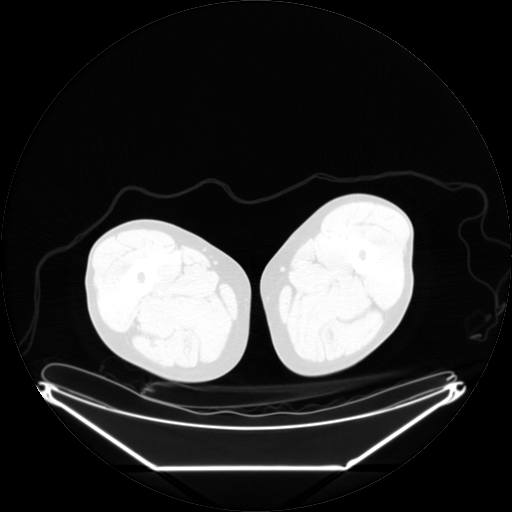
[im 54/267]
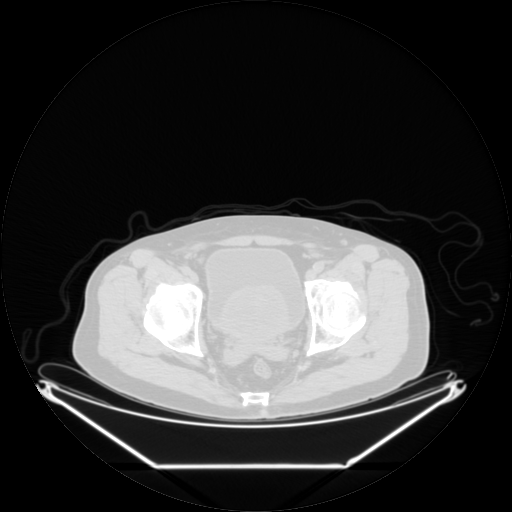
[im 107/267]
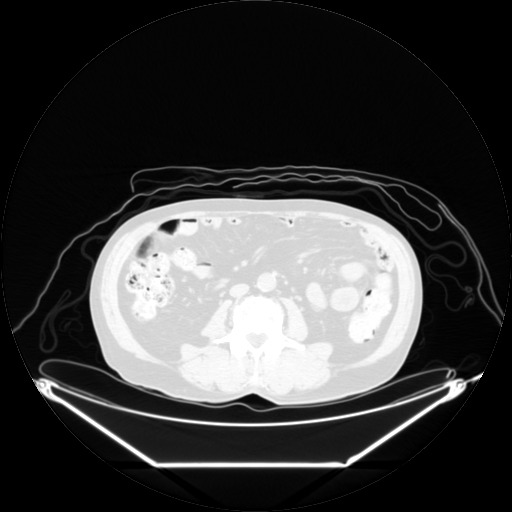
[im 160/267]
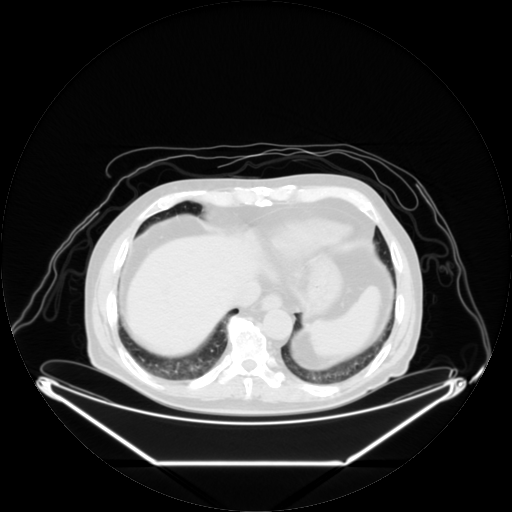
[im 213/267]
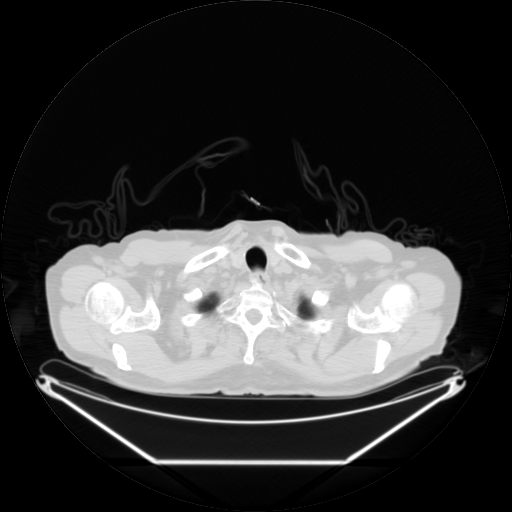
[im 267/267  brain]
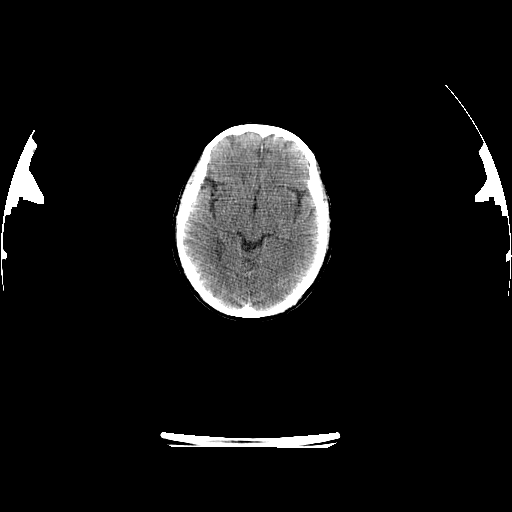

[Series 2: pet nac · axial · 3.3mm · 4.69mm/px · z∈[-853,+17]mm · 6 of 267 slices shown]
[im 1/267]
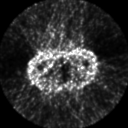
[im 54/267]
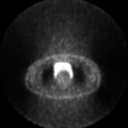
[im 107/267]
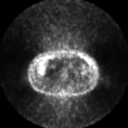
[im 160/267]
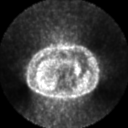
[im 213/267]
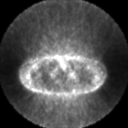
[im 267/267]
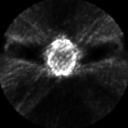

[Series 123: mip · coronal · 3.3mm · 4.69mm/px · 1 of 30 slices shown]
[im 1/30]
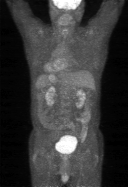

[Series 152: reformatted · axial · 3.3mm · 3.91mm/px · z∈[-853,+17]mm · 6 of 265 slices shown (1 of 2)]
[im 1/265]
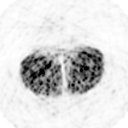
[im 53/265]
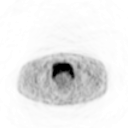
[im 106/265]
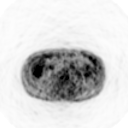
[im 159/265]
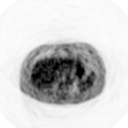
[im 212/265]
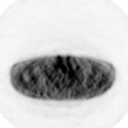
[im 265/265]
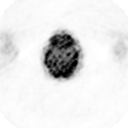

[Series 155: reformatted · coronal · 4.7mm · 6.98mm/px · 1 of 62 slices shown (2 of 2)]
[im 1/62]
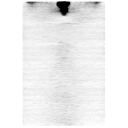

[25 of 25 positions shown; findings below may reference images not displayed]

FINDINGS: NECK

No hypermetabolic lymph nodes in the neck.

Stable coarse calcification anterior to the right submandibular
gland (series 2/ image 37).

CHEST

No hypermetabolic mediastinal or hilar nodes.

No suspicious pulmonary nodules on the CT scan.

Cardiomegaly. Coronary atherosclerosis.

ABDOMEN/PELVIS

No abnormal hypermetabolic activity within the liver, pancreas,
adrenal glands, or spleen.

No hypermetabolic lymph nodes in the abdomen or pelvis.

Prostatomegaly with enlargement of the central gland which indents
the base of the bladder.

SKELETON

No focal hypermetabolic activity to suggest skeletal metastasis.
IMPRESSION: No evidence of lymphomatous involvement.

## 2015-04-01 DIAGNOSIS — L255 Unspecified contact dermatitis due to plants, except food: Secondary | ICD-10-CM | POA: Diagnosis not present

## 2015-04-20 DIAGNOSIS — R972 Elevated prostate specific antigen [PSA]: Secondary | ICD-10-CM | POA: Diagnosis not present

## 2015-06-14 DIAGNOSIS — N401 Enlarged prostate with lower urinary tract symptoms: Secondary | ICD-10-CM | POA: Diagnosis not present

## 2015-06-14 DIAGNOSIS — H6691 Otitis media, unspecified, right ear: Secondary | ICD-10-CM | POA: Diagnosis not present

## 2015-07-05 DIAGNOSIS — N401 Enlarged prostate with lower urinary tract symptoms: Secondary | ICD-10-CM | POA: Diagnosis not present

## 2015-07-05 DIAGNOSIS — H11433 Conjunctival hyperemia, bilateral: Secondary | ICD-10-CM | POA: Diagnosis not present

## 2015-07-05 DIAGNOSIS — Z23 Encounter for immunization: Secondary | ICD-10-CM | POA: Diagnosis not present

## 2015-07-25 DIAGNOSIS — R972 Elevated prostate specific antigen [PSA]: Secondary | ICD-10-CM | POA: Diagnosis not present

## 2015-07-25 DIAGNOSIS — N403 Nodular prostate with lower urinary tract symptoms: Secondary | ICD-10-CM | POA: Diagnosis not present

## 2015-08-15 DIAGNOSIS — H18411 Arcus senilis, right eye: Secondary | ICD-10-CM | POA: Diagnosis not present

## 2015-08-15 DIAGNOSIS — Z961 Presence of intraocular lens: Secondary | ICD-10-CM | POA: Diagnosis not present

## 2015-08-22 DIAGNOSIS — C8200 Follicular lymphoma grade I, unspecified site: Secondary | ICD-10-CM | POA: Diagnosis not present

## 2015-08-22 DIAGNOSIS — C8209 Follicular lymphoma grade I, extranodal and solid organ sites: Secondary | ICD-10-CM | POA: Diagnosis not present

## 2015-08-22 DIAGNOSIS — M722 Plantar fascial fibromatosis: Secondary | ICD-10-CM | POA: Diagnosis not present

## 2015-08-22 DIAGNOSIS — D696 Thrombocytopenia, unspecified: Secondary | ICD-10-CM | POA: Diagnosis not present

## 2015-09-26 DIAGNOSIS — Q103 Other congenital malformations of eyelid: Secondary | ICD-10-CM | POA: Diagnosis not present

## 2015-09-26 DIAGNOSIS — H02055 Trichiasis without entropian left lower eyelid: Secondary | ICD-10-CM | POA: Diagnosis not present

## 2015-10-24 DIAGNOSIS — Z961 Presence of intraocular lens: Secondary | ICD-10-CM | POA: Diagnosis not present

## 2015-10-24 DIAGNOSIS — H16221 Keratoconjunctivitis sicca, not specified as Sjogren's, right eye: Secondary | ICD-10-CM | POA: Diagnosis not present

## 2015-10-24 DIAGNOSIS — Q103 Other congenital malformations of eyelid: Secondary | ICD-10-CM | POA: Diagnosis not present

## 2015-10-24 DIAGNOSIS — H16222 Keratoconjunctivitis sicca, not specified as Sjogren's, left eye: Secondary | ICD-10-CM | POA: Diagnosis not present

## 2015-10-31 DIAGNOSIS — L2081 Atopic neurodermatitis: Secondary | ICD-10-CM | POA: Diagnosis not present

## 2015-10-31 DIAGNOSIS — G2581 Restless legs syndrome: Secondary | ICD-10-CM | POA: Diagnosis not present

## 2015-10-31 DIAGNOSIS — Z23 Encounter for immunization: Secondary | ICD-10-CM | POA: Diagnosis not present

## 2015-10-31 DIAGNOSIS — Z0000 Encounter for general adult medical examination without abnormal findings: Secondary | ICD-10-CM | POA: Diagnosis not present

## 2015-10-31 DIAGNOSIS — N401 Enlarged prostate with lower urinary tract symptoms: Secondary | ICD-10-CM | POA: Diagnosis not present

## 2015-10-31 DIAGNOSIS — C8580 Other specified types of non-Hodgkin lymphoma, unspecified site: Secondary | ICD-10-CM | POA: Diagnosis not present

## 2015-10-31 DIAGNOSIS — B029 Zoster without complications: Secondary | ICD-10-CM | POA: Diagnosis not present

## 2015-11-10 DIAGNOSIS — Z1211 Encounter for screening for malignant neoplasm of colon: Secondary | ICD-10-CM | POA: Diagnosis not present

## 2015-12-15 ENCOUNTER — Encounter: Payer: Self-pay | Admitting: Internal Medicine

## 2016-01-23 DIAGNOSIS — R3912 Poor urinary stream: Secondary | ICD-10-CM | POA: Diagnosis not present

## 2016-01-23 DIAGNOSIS — N401 Enlarged prostate with lower urinary tract symptoms: Secondary | ICD-10-CM | POA: Diagnosis not present

## 2016-01-23 DIAGNOSIS — R972 Elevated prostate specific antigen [PSA]: Secondary | ICD-10-CM | POA: Diagnosis not present

## 2016-02-20 DIAGNOSIS — D751 Secondary polycythemia: Secondary | ICD-10-CM | POA: Diagnosis not present

## 2016-02-20 DIAGNOSIS — Z923 Personal history of irradiation: Secondary | ICD-10-CM | POA: Diagnosis not present

## 2016-02-20 DIAGNOSIS — D696 Thrombocytopenia, unspecified: Secondary | ICD-10-CM | POA: Diagnosis not present

## 2016-02-20 DIAGNOSIS — C8209 Follicular lymphoma grade I, extranodal and solid organ sites: Secondary | ICD-10-CM | POA: Diagnosis not present

## 2016-02-20 DIAGNOSIS — K5900 Constipation, unspecified: Secondary | ICD-10-CM | POA: Diagnosis not present

## 2016-03-12 DIAGNOSIS — L570 Actinic keratosis: Secondary | ICD-10-CM | POA: Diagnosis not present

## 2016-03-12 DIAGNOSIS — X32XXXD Exposure to sunlight, subsequent encounter: Secondary | ICD-10-CM | POA: Diagnosis not present

## 2016-03-12 DIAGNOSIS — D225 Melanocytic nevi of trunk: Secondary | ICD-10-CM | POA: Diagnosis not present

## 2016-04-30 DIAGNOSIS — R972 Elevated prostate specific antigen [PSA]: Secondary | ICD-10-CM | POA: Diagnosis not present

## 2016-07-09 ENCOUNTER — Other Ambulatory Visit: Payer: Self-pay

## 2016-07-23 DIAGNOSIS — R972 Elevated prostate specific antigen [PSA]: Secondary | ICD-10-CM | POA: Diagnosis not present

## 2016-07-23 DIAGNOSIS — N401 Enlarged prostate with lower urinary tract symptoms: Secondary | ICD-10-CM | POA: Diagnosis not present

## 2016-07-23 DIAGNOSIS — R3912 Poor urinary stream: Secondary | ICD-10-CM | POA: Diagnosis not present

## 2016-08-14 ENCOUNTER — Encounter (HOSPITAL_COMMUNITY): Payer: Medicare Other

## 2016-08-14 ENCOUNTER — Encounter (HOSPITAL_COMMUNITY): Payer: Self-pay | Admitting: Oncology

## 2016-08-14 ENCOUNTER — Encounter (HOSPITAL_COMMUNITY): Payer: Medicare Other | Attending: Oncology | Admitting: Oncology

## 2016-08-14 VITALS — BP 149/71 | HR 59 | Temp 97.9°F | Resp 16 | Ht 66.0 in | Wt 150.5 lb

## 2016-08-14 DIAGNOSIS — Z23 Encounter for immunization: Secondary | ICD-10-CM

## 2016-08-14 DIAGNOSIS — C8590 Non-Hodgkin lymphoma, unspecified, unspecified site: Secondary | ICD-10-CM

## 2016-08-14 DIAGNOSIS — Z87891 Personal history of nicotine dependence: Secondary | ICD-10-CM | POA: Diagnosis not present

## 2016-08-14 DIAGNOSIS — C8299 Follicular lymphoma, unspecified, extranodal and solid organ sites: Secondary | ICD-10-CM | POA: Diagnosis not present

## 2016-08-14 HISTORY — DX: Non-Hodgkin lymphoma, unspecified, unspecified site: C85.90

## 2016-08-14 LAB — CBC WITH DIFFERENTIAL/PLATELET
Basophils Absolute: 0 K/uL (ref 0.0–0.1)
Basophils Relative: 0 %
Eosinophils Absolute: 0.2 K/uL (ref 0.0–0.7)
Eosinophils Relative: 3 %
HCT: 50.3 % (ref 39.0–52.0)
Hemoglobin: 17.2 g/dL — ABNORMAL HIGH (ref 13.0–17.0)
Lymphocytes Relative: 30 %
Lymphs Abs: 2 K/uL (ref 0.7–4.0)
MCH: 33.5 pg (ref 26.0–34.0)
MCHC: 34.2 g/dL (ref 30.0–36.0)
MCV: 98.1 fL (ref 78.0–100.0)
Monocytes Absolute: 0.6 K/uL (ref 0.1–1.0)
Monocytes Relative: 9 %
Neutro Abs: 3.7 K/uL (ref 1.7–7.7)
Neutrophils Relative %: 58 %
Platelets: 126 K/uL — ABNORMAL LOW (ref 150–400)
RBC: 5.13 MIL/uL (ref 4.22–5.81)
RDW: 13.5 % (ref 11.5–15.5)
WBC: 6.4 K/uL (ref 4.0–10.5)

## 2016-08-14 LAB — COMPREHENSIVE METABOLIC PANEL WITH GFR
ALT: 32 U/L (ref 17–63)
AST: 31 U/L (ref 15–41)
Albumin: 4.2 g/dL (ref 3.5–5.0)
Alkaline Phosphatase: 69 U/L (ref 38–126)
Anion gap: 6 (ref 5–15)
BUN: 22 mg/dL — ABNORMAL HIGH (ref 6–20)
CO2: 28 mmol/L (ref 22–32)
Calcium: 9.7 mg/dL (ref 8.9–10.3)
Chloride: 105 mmol/L (ref 101–111)
Creatinine, Ser: 1.08 mg/dL (ref 0.61–1.24)
GFR calc Af Amer: >60 mL/min
GFR calc non Af Amer: >60 mL/min
Glucose, Bld: 96 mg/dL (ref 65–99)
Potassium: 4.6 mmol/L (ref 3.5–5.1)
Sodium: 139 mmol/L (ref 135–145)
Total Bilirubin: 0.7 mg/dL (ref 0.3–1.2)
Total Protein: 7.3 g/dL (ref 6.5–8.1)

## 2016-08-14 LAB — C-REACTIVE PROTEIN: CRP: 0.7 mg/dL

## 2016-08-14 LAB — LACTATE DEHYDROGENASE: LDH: 137 U/L (ref 98–192)

## 2016-08-14 LAB — SEDIMENTATION RATE: Sed Rate: 1 mm/hr (ref 0–16)

## 2016-08-14 MED ORDER — INFLUENZA VAC SPLIT QUAD 0.5 ML IM SUSY
0.5000 mL | PREFILLED_SYRINGE | Freq: Once | INTRAMUSCULAR | Status: AC
  Administered 2016-08-14: 0.5 mL via INTRAMUSCULAR

## 2016-08-14 MED ORDER — INFLUENZA VAC SPLIT QUAD 0.5 ML IM SUSY
PREFILLED_SYRINGE | INTRAMUSCULAR | Status: AC
  Filled 2016-08-14: qty 0.5

## 2016-08-14 NOTE — Patient Instructions (Addendum)
[place] at [facility] Discharge Instructions  RECOMMENDATIONS MADE BY THE CONSULTANT AND ANY TEST RESULTS WILL BE SENT TO YOUR REFERRING PHYSICIAN.  You were seen by Kirby Crigler PA-C and Dr. Whitney Muse today.  You were given a flu shot today. You will have labs drawn today. Return in 6 months for labs and follow up.   Thank you for choosing [place] at [facility] to provide your oncology and hematology care.  To afford each patient quality time with our provider, please arrive at least 15 minutes before your scheduled appointment time.   Beginning January 23rd 2017 lab work for the [facility] will be done in the  Main lab at [facility] on 1st floor. If you have a lab appointment with the [facility] please come in thru the  Main Entrance and check in at the main information desk  You need to re-schedule your appointment should you arrive 10 or more minutes late.  We strive to give you quality time with our providers, and arriving late affects you and other patients whose appointments are after yours.  Also, if you no show three or more times for appointments you may be dismissed from the clinic at the providers discretion.     Again, thank you for choosing [facility].  Our hope is that these requests will decrease the amount of time that you wait before being seen by our physicians.       _____________________________________________________________  Should you have questions after your visit to [facility], please contact our office at [phone] between the hours of 8:30 a.m. and 4:30 p.m.  Voicemails left after 4:30 p.m. will not be returned until the following business day.  For prescription refill requests, have your pharmacy contact our office.         Resources For Cancer Patients and their Caregivers ? American Cancer Society: Can assist with transportation, wigs, general needs, runs Look Good  Feel Better.        817-751-3322 ? Cancer Care: Provides financial assistance, online support groups, medication/co-pay assistance.  1-800-813-HOPE (838) 250-6114) ? [place] Assists [place] cancer patients and their families through emotional , educational and financial support.  814-253-8249 ? [place] DSS Where to apply for food stamps, Medicaid and utility assistance. (604)227-3279 ? [facility]: Transportation to medical appointments. 212-728-0393 ? Social Security Administration: May apply for disability if have a Stage IV cancer. (778)603-0693 614-879-2559 ? [employer], Disability and Transit Services: Assists with nutrition, care and transit needs. [place] Support Programs: @10RELATIVEDAYS @ > Cancer Support Group  2nd Tuesday of the month 1pm-2pm, Journey Room  > Creative Journey  3rd Tuesday of the month 1130am-1pm, Journey Room  > Look Good Feel Better  1st Wednesday of the month 10am-12 noon, Journey Room (Call [facility] to register [phone])

## 2016-08-14 NOTE — Progress Notes (Signed)
Pt given flu shot in left deltoid. Pt tolerated well. Pt stable and discharged home ambulatory.  

## 2016-08-14 NOTE — Progress Notes (Signed)
[facility] Hematology/Oncology Consultation   Name: Arthur Walls      MRN: [id number]    Date: 08/14/2016 Time:12:37 PM   REFERRING PHYSICIAN:  Sinda Du, MD (Primary Care Provider)  REASON FOR CONSULT:  Establishment of ongoing oncology care.   DIAGNOSIS:  Stage IE non-hodgkin lymphoma    Non-Hodgkin lymphoma ([place])   03/13/2010 Initial Diagnosis    Non-Hodgkin lymphoma ([employer]), biopsy of paraspinous mass in the cervical area      03/13/2010 - 04/14/2010 Radiation Therapy    Radiation delivered to 6.5 cm mass at the T1 level, 30 Gy.      04/25/2010 - 06/11/2011 Chemotherapy    Rituxan 375 mg/m2 initiated weekly x 4 followed by 6 doses of maintenance Rituxan given every 3 months for 1.5 years.       12/12/2011 PET scan    No findings to suggest residual disease on today's examination. Specifically, no mass or hypermetabolic activity noted around the T1 and T2 neural foramina (where the patient's primary lesion was first discovered in February 2011).      08/14/2013 PET scan    No evidence of lymphomatous involvement.        HISTORY OF PRESENT ILLNESS:   Arthur Walls is a 82 y.o. male with a medical history significant for H/O Stage IE NHL, follicular who is referred to the [facility] for transfer of medical oncology care for Stage IE NHL, follicular type.  Essentially, the patient presented in 2011 with a 2 year history of left arm dysfunction and pain.  This was worked up with imaging by his primary care provider which led to a biopsy at [facility].  Once diagnosis was made (Follicular lymphoma, low-grade), he was treated with XRT by Dr. Pablo Ledger followed by weekly Rituxan x 4 then maintenance Rituxan every 3 months for 1.5 years by Dr. Jacquiline Doe.  He denies any B symptoms.  His weight is stable, he reports.  He notes that he is very active, doing push ups, curls, etc.  He notes that his weight did drop to ~ 140 lbs but this was intentional.  He notes  that his son encouraged him to increase his weight, and therefore, he was able to do this and maintain his weight at ~ 150 lbs.  He notes that the most he weighed was ~160 lbs.  He denies any new lumps or bumps.  He denies any new pain.  He is having issues with his left eye for which he is seeing ophthalmology for.    Review of Systems  Constitutional: Negative.  Negative for chills, fever, malaise/fatigue and weight loss.  HENT: Negative.   Eyes: Positive for discharge (clear, left eye). Negative for blurred vision and double vision.  Respiratory: Negative.  Negative for cough, sputum production and shortness of breath.   Cardiovascular: Negative.  Negative for chest pain.  Gastrointestinal: Negative.  Negative for abdominal pain, constipation, diarrhea, nausea and vomiting.  Genitourinary: Negative.  Negative for dysuria.  Musculoskeletal: Negative.   Skin: Negative.  Negative for rash.  Neurological: Negative.  Negative for weakness and headaches.  Endo/Heme/Allergies: Negative.   Psychiatric/Behavioral: Negative.   14 point review of systems was performed and is negative except as detailed under history of present illness and above    PAST MEDICAL HISTORY:   Past Medical History:  Diagnosis Date  . Arthritis   . Hyperlipidemia   . Non Hodgkin's lymphoma ([place]) 2012  .  Non-Hodgkin lymphoma ([place]) 08/14/2016  . Restless leg syndrome     ALLERGIES: No Known Allergies    MEDICATIONS: I have reviewed the patient's current medications.    Current Outpatient Prescriptions on File Prior to Visit  Medication Sig Dispense Refill  . diazepam (VALIUM) 5 MG tablet Take 5 mg by mouth daily.     . fish oil-omega-3 fatty acids 1000 MG capsule Take 1 g by mouth daily.    . Melatonin 5 MG CAPS Take by mouth daily.    . Multiple Vitamin (MULTIVITAMIN) tablet Take 1 tablet by mouth daily.    . Naproxen Sodium (ALEVE PO) Take by mouth as needed.    Marland Kitchen UNABLE TO FIND Med Name: Fennel seed  capsule.  Takes 1 cap twice daily     No current facility-administered medications on file prior to visit.      PAST SURGICAL HISTORY Past Surgical History:  Procedure Laterality Date  . ELBOW SURGERY  2003   right  . EYE SURGERY     bilateral; for vision correction  . [place]  . TRIGGER FINGER RELEASE  2002   left hand    FAMILY HISTORY: Family History  Problem Relation Age of Onset  . Stomach cancer Neg Hx   . Rectal cancer Neg Hx   . Esophageal cancer Neg Hx   . Colon cancer Neg Hx    He has two sons.  One is a Water quality scientist.  The other is an Garment/textile technologist at [employer].  He has 4 grandchildren.  SOCIAL HISTORY:  reports that he quit smoking about 68 years ago. He has never used smokeless tobacco. He reports that he does not drink alcohol or use drugs.  He does admit to about 10 pack year smoking history.  He is Psychologist, forensic.  He is divorced x many years, but it is an amicable relationship.  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  Social History   Social History  . Marital status: Legally Separated    Spouse name: N/A  . Number of children: N/A  . Years of education: N/A   Social History Main Topics  . Smoking status: Former Smoker    Quit date: 01/24/1948  . Smokeless tobacco: Never Used  . Alcohol use No  . Drug use: No  . Sexual activity: Not Asked   Other Topics Concern  . None   Social History Narrative  . None    PERFORMANCE STATUS: The patient's performance status is 0 - Asymptomatic  PHYSICAL EXAM: Most Recent Vital Signs: Blood pressure (!) 149/71, pulse (!) 59, temperature 97.9 F (36.6 C), temperature source Oral, resp. rate 16, height '5\' 6"'$  (1.676 m), weight 150 lb 8 oz (68.3 kg), SpO2 99 %. General appearance: alert, cooperative, appears stated age, no distress and unaccompanied Head: Normocephalic, without obvious abnormality, atraumatic Eyes: negative findings: lids and lashes normal, conjunctivae and sclerae  normal and corneas clear Throat: lips, mucosa, and tongue normal; teeth and gums normal Neck: no adenopathy, supple, symmetrical, trachea midline and thyroid not enlarged, symmetric, no tenderness/mass/nodules Back: symmetric, no curvature. ROM normal. No CVA tenderness. Lungs: clear to auscultation bilaterally and normal percussion bilaterally Heart: regular rate and rhythm, S1, S2 normal, no murmur, click, rub or gallop Abdomen: soft, non-tender; bowel sounds normal; no masses,  no organomegaly Extremities: extremities normal, atraumatic, no cyanosis or edema Skin: Skin color, texture, turgor normal. No rashes or lesions Lymph nodes: Cervical, supraclavicular, and axillary  nodes normal. Neurologic: Grossly normal  LABORATORY DATA:  No results found for this or any previous visit (from the past 48 hour(s)).    RADIOGRAPHY: No results found.     PATHOLOGY:  N/A   ASSESSMENT/PLAN:   Non-Hodgkin lymphoma   Stage IE low-grade follicular non-Hodgkin's lymphoma S/P needle biopsy of a paraspinous mass on 03/13/2010 with a 2 year history of difficulty using his left arm with discomfort in the T1 distribution. At the time of diagnosis he had a 6.5 cm mass in the paraspinous area. Further staging study showed no evidence of disease elsewhere including PET scan. Patient is very active, asymptomatic.  Oncology history developed.  Staging in [facility] problem list completed.  Labs today: CBC diff, CMET, LDH, ESR, CRP, TSH.  I personally reviewed and went over laboratory results with the patient.  The results are noted within this dictation.  Labs in 6 months: CBC diff, CMET, LDH, TSH.  Influenza vaccine is given today.  Return in 6 months for follow-up.  If all is well, we could consider changing frequency of appointments to annually per NCCN guidelines.  The NCCN guidelines for surveillance of Follicular Lymphoma are (5.2017):  A. H+P every 3-6 months x 5 years and then annually or as  indicated  B. Labs every 3-6 months x 5 years and then annually or as indicated  C. Surveillance imaging no more than every 6 months x 2 years post-treatment and then no more than annually or as clinically indicated.   ORDERS PLACED FOR THIS ENCOUNTER: Orders Placed This Encounter  Procedures  . CBC with Differential  . Comprehensive metabolic panel  . Sedimentation rate  . Lactate dehydrogenase  . C-reactive protein  . CBC with Differential  . Comprehensive metabolic panel  . Lactate dehydrogenase  . TSH  . TSH    MEDICATIONS PRESCRIBED THIS ENCOUNTER: Meds ordered this encounter  Medications  . Influenza vac split quadrivalent PF (FLUARIX) injection 0.5 mL    All questions were answered. The patient knows to call the clinic with any problems, questions or concerns. We can certainly see the patient much sooner if necessary.  This note is electronically signed [id number] Cyril Mourning, MD  08/14/2016 12:37 PM

## 2016-08-14 NOTE — Assessment & Plan Note (Addendum)
Stage IE low-grade follicular non-Hodgkin's lymphoma S/P needle biopsy of a paraspinous mass on 03/13/2010 with a 2 year history of difficulty using his left arm with discomfort in the T1 distribution. At the time of diagnosis he had a 6.5 cm mass in the paraspinous area. Further staging study showed no evidence of disease elsewhere including PET scan.  Oncology history developed.  Staging in [facility] problem list completed.  Labs today: CBC diff, CMET, LDH, ESR, CRP, TSH.  I personally reviewed and went over laboratory results with the patient.  The results are noted within this dictation.  Labs in 6 months: CBC diff, CMET, LDH, TSH.  Influenza vaccine is given today.  Return in 6 months for follow-up.  If all is well, we could consider changing frequency of appointments to annually per NCCN guidelines.    The NCCN guidelines for surveillance of Follicular Lymphoma are (5.2017):  A. H+P every 3-6 months x 5 years and then annually or as indicated  B. Labs every 3-6 months x 5 years and then annually or as indicated  C. Surveillance imaging no more than every 6 months x 2 years post-treatment and then no more than annually or as clinically indicated.   

## 2016-08-27 ENCOUNTER — Encounter (HOSPITAL_COMMUNITY): Payer: Self-pay | Admitting: Oncology

## 2016-10-01 ENCOUNTER — Ambulatory Visit (INDEPENDENT_AMBULATORY_CARE_PROVIDER_SITE_OTHER): Payer: Medicare Other | Admitting: Otolaryngology

## 2016-10-01 DIAGNOSIS — J342 Deviated nasal septum: Secondary | ICD-10-CM | POA: Diagnosis not present

## 2016-10-01 DIAGNOSIS — J343 Hypertrophy of nasal turbinates: Secondary | ICD-10-CM

## 2016-10-01 DIAGNOSIS — J310 Chronic rhinitis: Secondary | ICD-10-CM

## 2016-11-08 DIAGNOSIS — Z0000 Encounter for general adult medical examination without abnormal findings: Secondary | ICD-10-CM | POA: Diagnosis not present

## 2016-11-15 ENCOUNTER — Ambulatory Visit (INDEPENDENT_AMBULATORY_CARE_PROVIDER_SITE_OTHER): Payer: Medicare Other | Admitting: Otolaryngology

## 2016-11-15 DIAGNOSIS — J342 Deviated nasal septum: Secondary | ICD-10-CM | POA: Diagnosis not present

## 2016-11-15 DIAGNOSIS — J310 Chronic rhinitis: Secondary | ICD-10-CM | POA: Diagnosis not present

## 2017-01-24 DIAGNOSIS — L821 Other seborrheic keratosis: Secondary | ICD-10-CM | POA: Diagnosis not present

## 2017-01-24 DIAGNOSIS — C44319 Basal cell carcinoma of skin of other parts of face: Secondary | ICD-10-CM | POA: Diagnosis not present

## 2017-02-12 ENCOUNTER — Encounter (HOSPITAL_COMMUNITY): Payer: Medicare Other

## 2017-02-12 ENCOUNTER — Encounter (HOSPITAL_COMMUNITY): Payer: Medicare Other | Attending: Hematology & Oncology | Admitting: Oncology

## 2017-02-12 ENCOUNTER — Encounter (HOSPITAL_COMMUNITY): Payer: Self-pay | Admitting: Oncology

## 2017-02-12 VITALS — BP 151/68 | HR 60 | Temp 97.8°F | Resp 16 | Ht 66.0 in | Wt 155.0 lb

## 2017-02-12 DIAGNOSIS — C8299 Follicular lymphoma, unspecified, extranodal and solid organ sites: Secondary | ICD-10-CM

## 2017-02-12 LAB — COMPREHENSIVE METABOLIC PANEL
ALK PHOS: 65 U/L (ref 38–126)
ALT: 25 U/L (ref 17–63)
ANION GAP: 7 (ref 5–15)
AST: 24 U/L (ref 15–41)
Albumin: 4.1 g/dL (ref 3.5–5.0)
BILIRUBIN TOTAL: 0.7 mg/dL (ref 0.3–1.2)
BUN: 21 mg/dL — ABNORMAL HIGH (ref 6–20)
CALCIUM: 10 mg/dL (ref 8.9–10.3)
CO2: 29 mmol/L (ref 22–32)
Chloride: 104 mmol/L (ref 101–111)
Creatinine, Ser: 1.33 mg/dL — ABNORMAL HIGH (ref 0.61–1.24)
GFR, EST AFRICAN AMERICAN: 56 mL/min — AB (ref >60)
GFR, EST NON AFRICAN AMERICAN: 48 mL/min — AB (ref >60)
Glucose, Bld: 99 mg/dL (ref 65–99)
Potassium: 4.9 mmol/L (ref 3.5–5.1)
SODIUM: 140 mmol/L (ref 135–145)
TOTAL PROTEIN: 7.2 g/dL (ref 6.5–8.1)

## 2017-02-12 LAB — CBC WITH DIFFERENTIAL/PLATELET
Basophils Absolute: 0 10*3/uL (ref 0.0–0.1)
Basophils Relative: 0 %
EOS ABS: 0.1 10*3/uL (ref 0.0–0.7)
Eosinophils Relative: 1 %
HCT: 51 % (ref 39.0–52.0)
HEMOGLOBIN: 17.3 g/dL — AB (ref 13.0–17.0)
LYMPHS ABS: 1.9 10*3/uL (ref 0.7–4.0)
Lymphocytes Relative: 31 %
MCH: 33.1 pg (ref 26.0–34.0)
MCHC: 33.9 g/dL (ref 30.0–36.0)
MCV: 97.5 fL (ref 78.0–100.0)
MONOS PCT: 9 %
Monocytes Absolute: 0.5 10*3/uL (ref 0.1–1.0)
NEUTROS PCT: 59 %
Neutro Abs: 3.6 10*3/uL (ref 1.7–7.7)
Platelets: 136 10*3/uL — ABNORMAL LOW (ref 150–400)
RBC: 5.23 MIL/uL (ref 4.22–5.81)
RDW: 13.4 % (ref 11.5–15.5)
WBC: 6.1 10*3/uL (ref 4.0–10.5)

## 2017-02-12 LAB — LACTATE DEHYDROGENASE: LDH: 130 U/L (ref 98–192)

## 2017-02-12 NOTE — Patient Instructions (Signed)
[facility] Discharge Instructions  RECOMMENDATIONS MADE BY THE CONSULTANT AND ANY TEST RESULTS WILL BE SENT TO YOUR REFERRING PHYSICIAN.  You were seen today by Tom Kefalas PA-C. Return in 6 months for labs and follow up.   Thank you for choosing [facility] at [facility] to provide your oncology and hematology care.  To afford each patient quality time with our provider, please arrive at least 15 minutes before your scheduled appointment time.    If you have a lab appointment with the Cancer Center please come in thru the  Main Entrance and check in at the main information desk  You need to re-schedule your appointment should you arrive 10 or more minutes late.  We strive to give you quality time with our providers, and arriving late affects you and other patients whose appointments are after yours.  Also, if you no show three or more times for appointments you may be dismissed from the clinic at the providers discretion.     Again, thank you for choosing [facility].  Our hope is that these requests will decrease the amount of time that you wait before being seen by our physicians.       _____________________________________________________________  Should you have questions after your visit to [facility], please contact our office at [phone] between the hours of 8:30 a.m. and 4:30 p.m.  Voicemails left after 4:30 p.m. will not be returned until the following business day.  For prescription refill requests, have your pharmacy contact our office.       Resources For Cancer Patients and their Caregivers ? American Cancer Society: Can assist with transportation, wigs, general needs, runs Look Good Feel Better.        1-888-227-6333 ? Cancer Care: Provides financial assistance, online support groups, medication/co-pay assistance.  1-800-813-HOPE (4673) ? [facility] Assists [place] cancer patients and their families through emotional , educational and financial support.  336-427-4357 ? [place] DSS Where to apply for food stamps, Medicaid and utility assistance. 336-342-1394 ? [facility]: Transportation to medical appointments. 336-347-2287 ? Social Security Administration: May apply for disability if have a Stage IV cancer. 336-342-7796 1-800-772-1213 ? [place] Aging, Disability and Transit Services: Assists with nutrition, care and transit needs. 336-349-2343  Cancer Center Support Programs: @10RELATIVEDAYS@ > Cancer Support Group  2nd Tuesday of the month 1pm-2pm, Journey Room  > Creative Journey  3rd Tuesday of the month 1130am-1pm, Journey Room  > Look Good Feel Better  1st Wednesday of the month 10am-12 noon, Journey Room (Call American Cancer Society to register 1-800-395-5775)    

## 2017-02-12 NOTE — Progress Notes (Signed)
Arthur Bogus, MD [place] [address]  Follicular lymphoma of solid organ excluding spleen, unspecified follicular lymphoma type ([place]) - Plan: CBC with Differential, Comprehensive metabolic panel, Lactate dehydrogenase, TSH  CURRENT THERAPY: Surveillance per NCCN guidelines  INTERVAL HISTORY: Arthur Walls 83 y.o. male returns for followup of Stage IE low-grade follicular non-Hodgkin's lymphoma S/P needle biopsy of a paraspinous mass on 03/13/2010 with a 2 year history of difficulty using his left arm with discomfort in the T1 distribution. At the time of diagnosis he had a 6.5 cm mass in the paraspinous area. Further staging study showed no evidence of disease elsewhere including PET scan.     Non-Hodgkin lymphoma ([place])   03/13/2010 Initial Diagnosis    Non-Hodgkin lymphoma ([place]), biopsy of paraspinous mass in the cervical area      03/13/2010 - 04/14/2010 Radiation Therapy    Radiation delivered to 6.5 cm mass at the T1 level, 30 Gy.      04/25/2010 - 06/11/2011 Chemotherapy    Rituxan 375 mg/m2 initiated weekly x 4 followed by 6 doses of maintenance Rituxan given every 3 months for 1.5 years.       12/12/2011 PET scan    No findings to suggest residual disease on today's examination. Specifically, no mass or hypermetabolic activity noted around the T1 and T2 neural foramina (where the patient's primary lesion was first discovered in February 2011).      08/14/2013 PET scan    No evidence of lymphomatous involvement.       He is doing well.  He denies any new complaints.  The last 6 months have been "great!" he reports.   He denies any B symptoms.  Appetite is good.  Weight is stable.  He denies any new lumps or bumps.  Review of Systems  Constitutional: Negative.  Negative for chills, fever and weight loss.  HENT: Negative.   Eyes: Negative.   Respiratory: Negative.  Negative for cough.   Cardiovascular: Negative.  Negative for  chest pain.  Gastrointestinal: Negative.  Negative for blood in stool, constipation, diarrhea, melena, nausea and vomiting.  Genitourinary: Negative.   Musculoskeletal: Negative.   Skin: Negative.   Neurological: Negative.  Negative for weakness.  Endo/Heme/Allergies: Negative.   Psychiatric/Behavioral: Negative.     Past Medical History:  Diagnosis Date  . Arthritis   . Hyperlipidemia   . Non Hodgkin's lymphoma ([place]) 2012  . Non-Hodgkin lymphoma ([place]) 08/14/2016  . Restless leg syndrome     Past Surgical History:  Procedure Laterality Date  . ELBOW SURGERY  2003   right  . EYE SURGERY     bilateral; for vision correction  . [place]  . TRIGGER FINGER RELEASE  2002   left hand    Family History  Problem Relation Age of Onset  . Stomach cancer Neg Hx   . Rectal cancer Neg Hx   . Esophageal cancer Neg Hx   . Colon cancer Neg Hx     Social History   Social History  . Marital status: Legally Separated    Spouse name: N/A  . Number of children: N/A  . Years of education: N/A   Social History Main Topics  . Smoking status: Former Smoker    Quit date: 01/24/1948  . Smokeless tobacco: Never Used  . Alcohol use No  . Drug use: No  . Sexual activity: Not Asked   Other Topics Concern  .  None   Social History Narrative  . None     PHYSICAL EXAMINATION  ECOG PERFORMANCE STATUS: 0 - Asymptomatic  Vitals:   02/12/17 1150  BP: (!) 151/68  Pulse: 60  Resp: 16  Temp: 97.8 F (36.6 C)     GENERAL:alert, no distress, well nourished, well developed, comfortable, cooperative, smiling and unaccompanied SKIN: skin color, texture, turgor are normal, no rashes or significant lesions HEAD: Normocephalic, No masses, lesions, tenderness or abnormalities EYES: normal, EOMI, Conjunctiva are pink and non-injected EARS: External ears normal OROPHARYNX:lips, buccal mucosa, and tongue normal and mucous membranes are moist  NECK: supple, no adenopathy,  trachea midline LYMPH:  no palpable lymphadenopathy, no hepatosplenomegaly BREAST:not examined LUNGS: clear to auscultation  HEART: regular rate & rhythm, no murmurs and no gallops ABDOMEN:abdomen soft, non-tender and normal bowel sounds BACK: Back symmetric, no curvature. EXTREMITIES:less then 2 second capillary refill, no joint deformities, effusion, or inflammation, no edema, no skin discoloration, no cyanosis  NEURO: alert & oriented x 3 with fluent speech, no focal motor/sensory deficits, gait normal   LABORATORY DATA: CBC    Component Value Date/Time   WBC 6.1 02/12/2017 1045   RBC 5.23 02/12/2017 1045   HGB 17.3 (H) 02/12/2017 1045   HCT 51.0 02/12/2017 1045   PLT 136 (L) 02/12/2017 1045   MCV 97.5 02/12/2017 1045   MCH 33.1 02/12/2017 1045   MCHC 33.9 02/12/2017 1045   RDW 13.4 02/12/2017 1045   LYMPHSABS 1.9 02/12/2017 1045   MONOABS 0.5 02/12/2017 1045   EOSABS 0.1 02/12/2017 1045   BASOSABS 0.0 02/12/2017 1045      Chemistry      Component Value Date/Time   NA 140 02/12/2017 1045   K 4.9 02/12/2017 1045   CL 104 02/12/2017 1045   CO2 29 02/12/2017 1045   BUN 21 (H) 02/12/2017 1045   CREATININE 1.33 (H) 02/12/2017 1045      Component Value Date/Time   CALCIUM 10.0 02/12/2017 1045   ALKPHOS 65 02/12/2017 1045   AST 24 02/12/2017 1045   ALT 25 02/12/2017 1045   BILITOT 0.7 02/12/2017 1045        PENDING LABS:   RADIOGRAPHIC STUDIES:  No results found.   PATHOLOGY:    ASSESSMENT AND PLAN:  Non-Hodgkin lymphoma ([place]) Stage IE low-grade follicular non-Hodgkin's lymphoma S/P needle biopsy of a paraspinous mass on 03/13/2010 with a 2 year history of difficulty using his left arm with discomfort in the T1 distribution. At the time of diagnosis he had a 6.5 cm mass in the paraspinous area. Further staging study showed no evidence of disease elsewhere including PET scan. Patient is very active, asymptomatic.  Oncology history is up to date.  Labs  today: CBC diff, CMET, LDH, TSH.  I personally reviewed and went over laboratory results with the patient.  The results are noted within this dictation.  Labs in 6 months: CBC diff, CMET, LDH, TSH.  Problem list reviewed with patient and edited accordingly.  Medications are reviewed with the patient and edited accordingly.  Return in 6 months for follow-up.    He knows to call us with any new unexplained fevers, chills, night sweats, change in appetite, and unintentional weight loss.  Is also to call with any new lumps/bumps.  More than 50% of the time spent with the patient was utilized for counseling and coordination of care.    ORDERS PLACED FOR THIS ENCOUNTER: Orders Placed This Encounter  Procedures  . CBC with Differential  .  Comprehensive metabolic panel  . Lactate dehydrogenase  . TSH    MEDICATIONS PRESCRIBED THIS ENCOUNTER: No orders of the defined types were placed in this encounter.   THERAPY PLAN:  NCCN guidelines recommends the follow surveillance for Stage III-IV NHL for those who attain a complete response to therapy (2.2017):  A. H&P every 3-6 months for 5 years, then yearly or as clinically indicated.  B. Labs every 3-6 months for 5 years and then annually or as clinically indicated.  C. CT imaging no more than every 6 months for the first two years after completion of therapy and then only as clinically indicated.   All questions were answered. The patient knows to call the clinic with any problems, questions or concerns. We can certainly see the patient much sooner if necessary.  Patient and plan discussed with Dr. Twana First and she is in agreement with the aforementioned.   This note is electronically signed by: Doy Mince 02/12/2017 3:19 PM

## 2017-02-12 NOTE — Assessment & Plan Note (Addendum)
Stage IE low-grade follicular non-Hodgkin's lymphoma S/P needle biopsy of a paraspinous mass on 03/13/2010 with a 2 year history of difficulty using his left arm with discomfort in the T1 distribution. At the time of diagnosis he had a 6.5 cm mass in the paraspinous area. Further staging study showed no evidence of disease elsewhere including PET scan. Patient is very active, asymptomatic.  Oncology history is up to date.  Labs today: CBC diff, CMET, LDH, TSH.  I personally reviewed and went over laboratory results with the patient.  The results are noted within this dictation.  Labs in 6 months: CBC diff, CMET, LDH, TSH.  Problem list reviewed with patient and edited accordingly.  Medications are reviewed with the patient and edited accordingly.  Return in 6 months for follow-up.    He knows to call us with any new unexplained fevers, chills, night sweats, change in appetite, and unintentional weight loss.  Is also to call with any new lumps/bumps.  More than 50% of the time spent with the patient was utilized for counseling and coordination of care.

## 2017-03-08 DIAGNOSIS — N401 Enlarged prostate with lower urinary tract symptoms: Secondary | ICD-10-CM | POA: Diagnosis not present

## 2017-03-08 DIAGNOSIS — R972 Elevated prostate specific antigen [PSA]: Secondary | ICD-10-CM | POA: Diagnosis not present

## 2017-03-08 DIAGNOSIS — N138 Other obstructive and reflux uropathy: Secondary | ICD-10-CM | POA: Diagnosis not present

## 2017-03-20 ENCOUNTER — Encounter: Payer: Self-pay | Admitting: Orthopaedic Surgery

## 2017-03-20 ENCOUNTER — Ambulatory Visit (INDEPENDENT_AMBULATORY_CARE_PROVIDER_SITE_OTHER): Payer: Medicare Other | Admitting: Orthopaedic Surgery

## 2017-03-20 VITALS — BP 123/70 | HR 62 | Ht 66.0 in | Wt 153.0 lb

## 2017-03-20 DIAGNOSIS — M6530 Trigger finger, unspecified finger: Secondary | ICD-10-CM

## 2017-03-20 NOTE — Progress Notes (Signed)
Subjective:    Patient ID: Arthur Walls, male    DOB: 08-24-1934, 83 y.o.   MRN: [id number]  HPI He has triggering of the left long finger.  It has been getting worse over the last several months.  He has no trauma, no redness and no numbness.  He has had prior surgery for this finger and his left index finger.     Review of Systems  HENT: Negative for congestion.   Respiratory: Negative for cough and shortness of breath.   Cardiovascular: Negative for chest pain and leg swelling.  Endocrine: Negative for cold intolerance.  Musculoskeletal: Positive for arthralgias and myalgias.  Allergic/Immunologic: Negative for environmental allergies.   Past Medical History:  Diagnosis Date  . Arthritis   . Hyperlipidemia   . Non Hodgkin's lymphoma ([place]) 2012  . Non-Hodgkin lymphoma ([place]) 08/14/2016  . Restless leg syndrome     Past Surgical History:  Procedure Laterality Date  . ELBOW SURGERY  2003   right  . EYE SURGERY     bilateral; for vision correction  . [place]  . TRIGGER FINGER RELEASE  2002   left hand    Current Outpatient Prescriptions on File Prior to Visit  Medication Sig Dispense Refill  . diazepam (VALIUM) 5 MG tablet Take 5 mg by mouth daily.     . fish oil-omega-3 fatty acids 1000 MG capsule Take 1 g by mouth daily.    . Melatonin 5 MG CAPS Take by mouth daily.    . Multiple Vitamin (MULTIVITAMIN) tablet Take 1 tablet by mouth daily.    . Naproxen Sodium (ALEVE PO) Take by mouth as needed.    Marland Kitchen UNABLE TO FIND Med Name: Fennel seed capsule.  Takes 1 cap twice daily     No current facility-administered medications on file prior to visit.     Social History   Social History  . Marital status: Legally Separated    Spouse name: N/A  . Number of children: N/A  . Years of education: N/A   Occupational History  . Not on file.   Social History Main Topics  . Smoking status: Former Smoker    Quit date: 01/24/1948  . Smokeless tobacco: Never Used   . Alcohol use No  . Drug use: No  . Sexual activity: Not on file   Other Topics Concern  . Not on file   Social History Narrative  . No narrative on file    Family History  Problem Relation Age of Onset  . Stomach cancer Neg Hx   . Rectal cancer Neg Hx   . Esophageal cancer Neg Hx   . Colon cancer Neg Hx     BP 123/70   Pulse 62   Ht 5\' 6"  (1.676 m)   Wt 153 lb (69.4 kg)   BMI 24.69 kg/m      Objective:   Physical Exam  Constitutional: He is oriented to person, place, and time. He appears well-developed and well-nourished.  HENT:  Head: Normocephalic and atraumatic.  Eyes: Conjunctivae and EOM are normal. Pupils are equal, round, and reactive to light.  Neck: Normal range of motion. Neck supple.  Cardiovascular: Normal rate, regular rhythm and intact distal pulses.   Pulmonary/Chest: Effort normal.  Abdominal: Soft.  Musculoskeletal: He exhibits tenderness (he has triggering of the left long finger and pain at the A1 pulley area, well healed scar in area, NV instact.  No riggering of  other fingers.).  Neurological:  He is alert and oriented to person, place, and time. He has normal reflexes. He displays normal reflexes. No cranial nerve deficit. He exhibits normal muscle tone. Coordination normal.  Skin: Skin is warm and dry.  Psychiatric: He has a normal mood and affect. His behavior is normal. Judgment and thought content normal.  Vitals reviewed.         Assessment & Plan:   Encounter Diagnosis  Name Primary?  . Trigger finger, acquired Yes   Procedure note:  After permission from the patient and sterile prep of the left nondominant hand at the A1 pulley area of the long finger, 1% Xylocaine and 1 cc of DepoMedrol 40 was injected by sterile technique and tolerated well.  He was told he may need another procedure as it appears scar tissue has developed in the area.  Return in two weeks.  Call if any problem.  Electronically Signed Sanjuana Kava,  MD 5/9/20188:33 PM

## 2017-04-03 ENCOUNTER — Encounter: Payer: Self-pay | Admitting: Orthopaedic Surgery

## 2017-04-03 ENCOUNTER — Ambulatory Visit (INDEPENDENT_AMBULATORY_CARE_PROVIDER_SITE_OTHER): Payer: Medicare Other | Admitting: Orthopaedic Surgery

## 2017-04-03 VITALS — BP 125/66 | HR 61 | Temp 97.9°F | Ht 66.0 in | Wt 151.0 lb

## 2017-04-03 DIAGNOSIS — M6530 Trigger finger, unspecified finger: Secondary | ICD-10-CM

## 2017-04-03 NOTE — Progress Notes (Signed)
Patient Arthur Walls, male DOB:Mar 05, 1934, 83 y.o. UTM:[id number]  Chief Complaint  Patient presents with  . Follow-up    left long trigger finger    HPI  Arthur Walls is a 83 y.o. male who has triggering of the left long finger.  He has improved since the injection last time but has triggering when he first awakens.  He has no redness or swelling.  He may need surgery. HPI  Body mass index is 24.37 kg/m.  ROS  Review of Systems  HENT: Negative for congestion.   Respiratory: Negative for cough and shortness of breath.   Cardiovascular: Negative for chest pain and leg swelling.  Endocrine: Negative for cold intolerance.  Musculoskeletal: Positive for arthralgias and myalgias.  Allergic/Immunologic: Negative for environmental allergies.    Past Medical History:  Diagnosis Date  . Arthritis   . Hyperlipidemia   . Non Hodgkin's lymphoma ([place]) 2012  . Non-Hodgkin lymphoma ([place]) 08/14/2016  . Restless leg syndrome     Past Surgical History:  Procedure Laterality Date  . ELBOW SURGERY  2003   right  . EYE SURGERY     bilateral; for vision correction  . [place]  . TRIGGER FINGER RELEASE  2002   left hand    Family History  Problem Relation Age of Onset  . Stomach cancer Neg Hx   . Rectal cancer Neg Hx   . Esophageal cancer Neg Hx   . Colon cancer Neg Hx     Social History Social History  Substance Use Topics  . Smoking status: Former Smoker    Quit date: 01/24/1948  . Smokeless tobacco: Never Used  . Alcohol use No    No Known Allergies  Current Outpatient Prescriptions  Medication Sig Dispense Refill  . diazepam (VALIUM) 5 MG tablet Take 5 mg by mouth daily.     . fish oil-omega-3 fatty acids 1000 MG capsule Take 1 g by mouth daily.    . Melatonin 5 MG CAPS Take by mouth daily.    . Multiple Vitamin (MULTIVITAMIN) tablet Take 1 tablet by mouth daily.    . Naproxen Sodium (ALEVE PO) Take by mouth as needed.    Marland Kitchen UNABLE TO FIND Med Name:  Fennel seed capsule.  Takes 1 cap twice daily     No current facility-administered medications for this visit.      Physical Exam  Blood pressure 125/66, pulse 61, temperature 97.9 F (36.6 C), height 5\' 6"  (1.676 m), weight 151 lb (68.5 kg).  Constitutional: overall normal hygiene, normal nutrition, well developed, normal grooming, normal body habitus. Assistive device:none  Musculoskeletal: gait and station Limp none, muscle tone and strength are normal, no tremors or atrophy is present.  .  Neurological: coordination overall normal.  Deep tendon reflex/nerve stretch intact.  Sensation normal.  Cranial nerves II-XII intact.   Skin:   Normal overall no scars, lesions, ulcers or rashes. No psoriasis.  Psychiatric: Alert and oriented x 3.  Recent memory intact, remote memory unclear.  Normal mood and affect. Well groomed.  Good eye contact.  Cardiovascular: overall no swelling, no varicosities, no edema bilaterally, normal temperatures of the legs and arms, no clubbing, cyanosis and good capillary refill.  Lymphatic: palpation is normal.  He has well healed scars left long finger area of palm and the ring finger as well.  He has very slight triggering of the long finger.  NV is intact.  Grip is normal.  The patient has been educated  about the nature of the problem(s) and counseled on treatment options.  The patient appeared to understand what I have discussed and is in agreement with it.  Encounter Diagnosis  Name Primary?  . Trigger finger, acquired Yes    PLAN Call if any problems.  Precautions discussed.  Continue current medications.   Return to clinic 6 weeks   Electronically Signed Sanjuana Kava, MD 5/23/20182:37 PM

## 2017-05-22 ENCOUNTER — Ambulatory Visit: Payer: Medicare Other | Admitting: Orthopaedic Surgery

## 2017-07-01 DIAGNOSIS — L308 Other specified dermatitis: Secondary | ICD-10-CM | POA: Diagnosis not present

## 2017-08-14 ENCOUNTER — Encounter (HOSPITAL_COMMUNITY): Payer: Self-pay

## 2017-08-14 ENCOUNTER — Other Ambulatory Visit (HOSPITAL_COMMUNITY): Payer: Medicare Other

## 2017-08-14 ENCOUNTER — Encounter (HOSPITAL_COMMUNITY): Payer: Medicare Other | Attending: Adult Health | Admitting: Oncology

## 2017-08-14 ENCOUNTER — Encounter (HOSPITAL_COMMUNITY): Payer: Medicare Other

## 2017-08-14 ENCOUNTER — Ambulatory Visit (HOSPITAL_COMMUNITY): Payer: Medicare Other | Admitting: Adult Health

## 2017-08-14 VITALS — BP 133/78 | HR 65 | Resp 16 | Ht 66.0 in | Wt 151.0 lb

## 2017-08-14 DIAGNOSIS — C8299 Follicular lymphoma, unspecified, extranodal and solid organ sites: Secondary | ICD-10-CM

## 2017-08-14 DIAGNOSIS — C8217 Follicular lymphoma grade II, spleen: Secondary | ICD-10-CM

## 2017-08-14 DIAGNOSIS — Z23 Encounter for immunization: Secondary | ICD-10-CM | POA: Diagnosis not present

## 2017-08-14 LAB — COMPREHENSIVE METABOLIC PANEL
ALT: 22 U/L (ref 17–63)
AST: 24 U/L (ref 15–41)
Albumin: 4.1 g/dL (ref 3.5–5.0)
Alkaline Phosphatase: 68 U/L (ref 38–126)
Anion gap: 7 (ref 5–15)
BUN: 17 mg/dL (ref 6–20)
CHLORIDE: 105 mmol/L (ref 101–111)
CO2: 25 mmol/L (ref 22–32)
CREATININE: 1.21 mg/dL (ref 0.61–1.24)
Calcium: 9.4 mg/dL (ref 8.9–10.3)
GFR calc non Af Amer: 54 mL/min — ABNORMAL LOW (ref >60)
Glucose, Bld: 91 mg/dL (ref 65–99)
POTASSIUM: 4.4 mmol/L (ref 3.5–5.1)
SODIUM: 137 mmol/L (ref 135–145)
Total Bilirubin: 0.9 mg/dL (ref 0.3–1.2)
Total Protein: 6.8 g/dL (ref 6.5–8.1)

## 2017-08-14 LAB — CBC WITH DIFFERENTIAL/PLATELET
BASOS ABS: 0 10*3/uL (ref 0.0–0.1)
Basophils Relative: 0 %
EOS ABS: 0.1 10*3/uL (ref 0.0–0.7)
EOS PCT: 2 %
HCT: 48.5 % (ref 39.0–52.0)
Hemoglobin: 16.6 g/dL (ref 13.0–17.0)
Lymphocytes Relative: 32 %
Lymphs Abs: 1.7 10*3/uL (ref 0.7–4.0)
MCH: 33.7 pg (ref 26.0–34.0)
MCHC: 34.2 g/dL (ref 30.0–36.0)
MCV: 98.4 fL (ref 78.0–100.0)
Monocytes Absolute: 0.4 10*3/uL (ref 0.1–1.0)
Monocytes Relative: 8 %
NEUTROS PCT: 58 %
Neutro Abs: 3.1 10*3/uL (ref 1.7–7.7)
PLATELETS: 124 10*3/uL — AB (ref 150–400)
RBC: 4.93 MIL/uL (ref 4.22–5.81)
RDW: 13.3 % (ref 11.5–15.5)
WBC: 5.3 10*3/uL (ref 4.0–10.5)

## 2017-08-14 LAB — TSH: TSH: 2.381 u[IU]/mL (ref 0.350–4.500)

## 2017-08-14 LAB — LACTATE DEHYDROGENASE: LDH: 121 U/L (ref 98–192)

## 2017-08-14 NOTE — Progress Notes (Signed)
Sinda Du, MD [address] [place] [address]  Follicular lymphoma grade II of spleen [facility]) - Plan: CBC with Differential, Comprehensive metabolic panel, Lactate dehydrogenase, TSH  CURRENT THERAPY: Surveillance per NCCN guidelines  INTERVAL HISTORY: Arthur Walls 83 y.o. male returns for followup of Stage IE low-grade follicular non-Hodgkin's lymphoma S/P needle biopsy of a paraspinous mass on 03/13/2010 with a 2 year history of difficulty using his left arm with discomfort in the T1 distribution. At the time of diagnosis he had a 6.5 cm mass in the paraspinous area. Further staging study showed no evidence of disease elsewhere including PET scan.     Non-Hodgkin lymphoma ([place])   03/13/2010 Initial Diagnosis    Non-Hodgkin lymphoma ([place]), biopsy of paraspinous mass in the cervical area      03/13/2010 - 04/14/2010 Radiation Therapy    Radiation delivered to 6.5 cm mass at the T1 level, 30 Gy.      04/25/2010 - 06/11/2011 Chemotherapy    Rituxan 375 mg/m2 initiated weekly x 4 followed by 6 doses of maintenance Rituxan given every 3 months for 1.5 years.       12/12/2011 PET scan    No findings to suggest residual disease on today's examination. Specifically, no mass or hypermetabolic activity noted around the T1 and T2 neural foramina (where the patient's primary lesion was first discovered in February 2011).      08/14/2013 PET scan    No evidence of lymphomatous involvement.       Patient presents today for continued follow-up. He states that he's been having worsening symptoms is his voice very easily after he talks for a while since he's received radiation. He has seen ENT in the past. He states that he is gaining some weight since his last visit. Appetite is very good. Denies any fatigue, drenching night sweats, unexplained fevers or chills. Has occasional constipation. He still states very active and denies any limitations to him performing his  ADLs.  Review of Systems  Constitutional: Negative.  Negative for chills, fever and weight loss.  HENT: Negative.   Eyes: Negative.   Respiratory: Negative.  Negative for cough.   Cardiovascular: Negative.  Negative for chest pain.  Gastrointestinal: Negative.  Negative for blood in stool, constipation, diarrhea, melena, nausea and vomiting.  Genitourinary: Negative.   Musculoskeletal: Negative.   Skin: Negative.   Neurological: Negative.  Negative for weakness.  Endo/Heme/Allergies: Negative.   Psychiatric/Behavioral: Negative.     Past Medical History:  Diagnosis Date  . Arthritis   . Hyperlipidemia   . Non Hodgkin's lymphoma ([place]) 2012  . Non-Hodgkin lymphoma ([place]) 08/14/2016  . Restless leg syndrome     Past Surgical History:  Procedure Laterality Date  . ELBOW SURGERY  2003   right  . EYE SURGERY     bilateral; for vision correction  . [place]  . TRIGGER FINGER RELEASE  2002   left hand    Family History  Problem Relation Age of Onset  . Stomach cancer Neg Hx   . Rectal cancer Neg Hx   . Esophageal cancer Neg Hx   . Colon cancer Neg Hx     Social History   Social History  . Marital status: Legally Separated    Spouse name: N/A  . Number of children: N/A  . Years of education: N/A   Social History Main Topics  . Smoking status: Former Smoker  Quit date: 01/24/1948  . Smokeless tobacco: Never Used  . Alcohol use No  . Drug use: No  . Sexual activity: Not Asked   Other Topics Concern  . None   Social History Narrative  . None     PHYSICAL EXAMINATION  ECOG PERFORMANCE STATUS: 0 - Asymptomatic  Vitals:   08/14/17 1156  BP: 133/78  Pulse: 65  Resp: 16  SpO2: 97%    Constitutional: Well-developed, well-nourished, and in no distress. Appears younger than stated age. HENT:  Head: Normocephalic and atraumatic.  Mouth/Throat: No oropharyngeal exudate. Mucosa moist. Eyes: Pupils are equal, round, and reactive to light.  Conjunctivae are normal. No scleral icterus.  Neck: Normal range of motion. Neck supple. No JVD present.  Cardiovascular: Normal rate, regular rhythm and normal heart sounds.  Exam reveals no gallop and no friction rub.   No murmur heard. Pulmonary/Chest: Effort normal and breath sounds normal. No respiratory distress. No wheezes.No rales.  Abdominal: Soft. Bowel sounds are normal. No distension. There is no tenderness. There is no guarding.  Musculoskeletal: No edema or tenderness.  Lymphadenopathy:    No cervical or supraclavicular adenopathy. No axillary lymphadenopathy bilaterally. Neurological: Alert and oriented to person, place, and time. No cranial nerve deficit.  Skin: Skin is warm and dry. No rash noted. No erythema. No pallor.  Psychiatric: Affect and judgment normal.     LABORATORY DATA: CBC    Component Value Date/Time   WBC 5.3 08/14/2017 1059   RBC 4.93 08/14/2017 1059   HGB 16.6 08/14/2017 1059   HCT 48.5 08/14/2017 1059   PLT 124 (L) 08/14/2017 1059   MCV 98.4 08/14/2017 1059   MCH 33.7 08/14/2017 1059   MCHC 34.2 08/14/2017 1059   RDW 13.3 08/14/2017 1059   LYMPHSABS 1.7 08/14/2017 1059   MONOABS 0.4 08/14/2017 1059   EOSABS 0.1 08/14/2017 1059   BASOSABS 0.0 08/14/2017 1059      Chemistry      Component Value Date/Time   NA 137 08/14/2017 1059   K 4.4 08/14/2017 1059   CL 105 08/14/2017 1059   CO2 25 08/14/2017 1059   BUN 17 08/14/2017 1059   CREATININE 1.21 08/14/2017 1059      Component Value Date/Time   CALCIUM 9.4 08/14/2017 1059   ALKPHOS 68 08/14/2017 1059   AST 24 08/14/2017 1059   ALT 22 08/14/2017 1059   BILITOT 0.9 08/14/2017 1059        ASSESSMENT AND PLAN:  Stage IElow-grade follicular non-Hodgkin's lymphoma S/Pneedle biopsy of a paraspinous mass on 03/13/2010 with a 2 year history of difficulty using his left arm with discomfort in the T1 distribution. At the time of diagnosis he had a 6.5 cm mass in the paraspinous area. Further  staging study showed no evidence of disease elsewhere including PET scan.  -Clinically NED. -Reviewed labs with the patient. -RTC in 6 months for follow up with labs below. Can move out to yearly follow ups on next visit.   ORDERS PLACED FOR THIS ENCOUNTER: Orders Placed This Encounter  Procedures  . CBC with Differential  . Comprehensive metabolic panel  . Lactate dehydrogenase  . TSH     THERAPY PLAN:  NCCN guidelines recommends the follow surveillance for Stage III-IV NHL for those who attain a complete response to therapy (2.2017):  A. H&P every 3-6 months for 5 years, then yearly or as clinically indicated.  B. Labs every 3-6 months for 5 years and then annually or as clinically indicated.  C. CT imaging no more than every 6 months for the first two years after completion of therapy and then only as clinically indicated.   All questions were answered. The patient knows to call the clinic with any problems, questions or concerns. We can certainly see the patient much sooner if necessary.  Patient and plan discussed with Dr. Twana First and she is in agreement with the aforementioned.   This note is electronically signed by: Twana First, MD 08/14/2017 12:22 PM

## 2017-08-29 ENCOUNTER — Ambulatory Visit (INDEPENDENT_AMBULATORY_CARE_PROVIDER_SITE_OTHER): Payer: Medicare Other | Admitting: Orthopaedic Surgery

## 2017-08-29 ENCOUNTER — Encounter: Payer: Self-pay | Admitting: Orthopaedic Surgery

## 2017-08-29 VITALS — BP 116/70 | HR 58 | Temp 97.0°F | Ht 66.0 in | Wt 148.0 lb

## 2017-08-29 DIAGNOSIS — M6530 Trigger finger, unspecified finger: Secondary | ICD-10-CM

## 2017-08-29 NOTE — Progress Notes (Signed)
Patient Arthur Walls, male DOB:[birth year], 83 y.o. VOH:[id number]  Chief Complaint  Patient presents with  . Hand Pain    left middle / trigger     HPI  Arthur Walls is a 83 y.o. male who has triggering of the left long finger.  I injected the area in May and he did well but the triggering has returned.  I had told him surgery would resolve it. He would now like to consider surgery.  I will have him see Dr. Aline Walls.  He has no new trauma, no numbness. It is locked in the early morning and gets better with use. HPI  Body mass index is 23.89 kg/m.  ROS  Review of Systems  HENT: Negative for congestion.   Respiratory: Negative for cough and shortness of breath.   Cardiovascular: Negative for chest pain and leg swelling.  Endocrine: Negative for cold intolerance.  Musculoskeletal: Positive for arthralgias and myalgias.  Allergic/Immunologic: Negative for environmental allergies.    Past Medical History:  Diagnosis Date  . Arthritis   . Hyperlipidemia   . Non Hodgkin's lymphoma ([place]) 2012  . Non-Hodgkin lymphoma ([place]) 08/14/2016  . Restless leg syndrome     Past Surgical History:  Procedure Laterality Date  . ELBOW SURGERY  2003   right  . EYE SURGERY     bilateral; for vision correction  . [place]  . TRIGGER FINGER RELEASE  2002   left hand    Family History  Problem Relation Age of Onset  . Stomach cancer Neg Hx   . Rectal cancer Neg Hx   . Esophageal cancer Neg Hx   . Colon cancer Neg Hx     Social History Social History  Substance Use Topics  . Smoking status: Former Smoker    Quit date: 01/24/1948  . Smokeless tobacco: Never Used  . Alcohol use No    Allergies  Allergen Reactions  . Silodosin Other (See Comments)    Eyes watery and crust all around the eyes    Current Outpatient Prescriptions  Medication Sig Dispense Refill  . cholecalciferol (VITAMIN D) 1000 units tablet Take 2,000 Units by mouth daily.    . diazepam (VALIUM) 5 MG  tablet Take 5 mg by mouth daily.     Marland Kitchen docusate sodium (COLACE) 100 MG capsule Take 100 mg by mouth daily.    . Melatonin 5 MG CAPS Take by mouth daily.    . Multiple Vitamin (MULTIVITAMIN) tablet Take 1 tablet by mouth daily.    . tamsulosin (FLOMAX) 0.4 MG CAPS capsule TAKE 1 CAPSULE BY MOUTH DAILY     No current facility-administered medications for this visit.      Physical Exam  Blood pressure 116/70, pulse (!) 58, temperature (!) 97 F (36.1 C), height 5\' 6"  (1.676 m), weight 148 lb (67.1 kg).  Constitutional: overall normal hygiene, normal nutrition, well developed, normal grooming, normal body habitus. Assistive device:none  Musculoskeletal: gait and station Limp none, muscle tone and strength are normal, no tremors or atrophy is present.  .  Neurological: coordination overall normal.  Deep tendon reflex/nerve stretch intact.  Sensation normal.  Cranial nerves II-XII intact.   Skin:   Normal overall no scars, lesions, ulcers or rashes. No psoriasis.  Psychiatric: Alert and oriented x 3.  Recent memory intact, remote memory unclear.  Normal mood and affect. Well groomed.  Good eye contact.  Cardiovascular: overall no swelling, no varicosities, no edema bilaterally, normal temperatures of the legs  and arms, no clubbing, cyanosis and good capillary refill.  Lymphatic: palpation is normal.  All other systems reviewed and are negative   He has triggering of the left long finger.  He has pain over the A1 pulley.  NV intact. ROM is full but has triggering.  The patient has been educated about the nature of the problem(s) and counseled on treatment options.  The patient appeared to understand what I have discussed and is in agreement with it.  Encounter Diagnosis  Name Primary?  . Trigger finger, acquired Yes    PLAN Call if any problems.  Precautions discussed.  Continue current medications.   Return to clinic to see Dr. Aline Walls   Electronically Signed Arthur Kava,  MD 10/18/20182:17 PM

## 2017-09-04 ENCOUNTER — Encounter: Payer: Self-pay | Admitting: *Deleted

## 2017-09-04 ENCOUNTER — Encounter: Payer: Self-pay | Admitting: Orthopedic Surgery

## 2017-09-04 ENCOUNTER — Ambulatory Visit (INDEPENDENT_AMBULATORY_CARE_PROVIDER_SITE_OTHER): Payer: Medicare Other | Admitting: Orthopedic Surgery

## 2017-09-04 VITALS — BP 131/76 | HR 65 | Ht 66.0 in | Wt 149.0 lb

## 2017-09-04 DIAGNOSIS — M6530 Trigger finger, unspecified finger: Secondary | ICD-10-CM | POA: Diagnosis not present

## 2017-09-04 NOTE — Progress Notes (Signed)
Progress Note   Patient ID: Arthur Walls, male   DOB: 1934/08/04, 83 y.o.   MRN: [id number]  Chief Complaint  Patient presents with  . Follow-up    Consult left LONG  trigger finger    83 yo male with triggering left long finger with recurrence. He c/o several months of mild pain over the A1 pulley with catching; the pain is constant, dull and non radiating.  Its not better after injection      Review of Systems  HENT: Negative for hearing loss.   Neurological: Positive for tingling.   Current Meds  Medication Sig  . cholecalciferol (VITAMIN D) 1000 units tablet Take 2,000 Units by mouth daily.  . diazepam (VALIUM) 5 MG tablet Take 5 mg by mouth daily.   Marland Kitchen docusate sodium (COLACE) 100 MG capsule Take 100 mg by mouth daily.  . Melatonin 5 MG CAPS Take by mouth daily.  . Multiple Vitamin (MULTIVITAMIN) tablet Take 1 tablet by mouth daily.  . tamsulosin (FLOMAX) 0.4 MG CAPS capsule TAKE 1 CAPSULE BY MOUTH DAILY    Past Medical History:  Diagnosis Date  . Arthritis   . Hyperlipidemia   . Non Hodgkin's lymphoma ([place]) 2012  . Non-Hodgkin lymphoma ([place]) 08/14/2016  . Restless leg syndrome      Allergies  Allergen Reactions  . Silodosin Other (See Comments)    Eyes watery and crust all around the eyes    BP 131/76   Pulse 65   Ht 5\' 6"  (1.676 m)   Wt 149 lb (67.6 kg)   BMI 24.05 kg/m    Physical Exam Gen. appearance the patient's appearance is normal with normal grooming and  hygiene The patient is oriented to person place and time Mood and affect are normal GAIT: NORMAL   Ortho Exam  LEFT HAND: TENDER AT THE PALM OVER THE A1 PULLEY CATCHING OF THE LONG FINGER  NEURO NORMAL SENSATION VASCULAR NORMAL COLOR AND CAP REFILL FLEXOR TENDON STRENGTH NORMAL STABILITY NORMAL  EPITROCHLEA LYMPH NODES NORMAL   Medical decision-making Encounter Diagnosis  Name Primary?  . Trigger finger, acquired Yes   LEFT LONG FINGER  TRIGGER RELEASE //[id number]  [name],  MD 09/04/2017 8:57 AM

## 2017-09-04 NOTE — Progress Notes (Signed)
No precert required for CPT 26055 per Gretchen Portela. With [employer] scheduled for 09/12/2017. Patient aware of pre-op date and time.

## 2017-09-04 NOTE — Patient Instructions (Addendum)
Trigger Finger Trigger finger (stenosing tenosynovitis) is a condition that causes a finger to get stuck in a bent position. Each finger has a tough, cord-like tissue that connects muscle to bone (tendon), and each tendon is surrounded by a tunnel of tissue (tendon sheath). To move your finger, your tendon needs to slide freely through the sheath. Trigger finger happens when the tendon or the sheath thickens, making it difficult to move your finger. Trigger finger can affect any finger or a thumb. It may affect more than one finger. Mild cases may clear up with rest and medicine. Severe cases require more treatment. What are the causes? Trigger finger is caused by a thickened finger tendon or tendon sheath. The cause of this thickening is not known. What increases the risk? The following factors may make you more likely to develop this condition:  Doing activities that require a strong grip.  Having rheumatoid arthritis, gout, or diabetes.  Being 40-60 years old.  Being a woman.  What are the signs or symptoms? Symptoms of this condition include:  Pain when bending or straightening your finger.  Tenderness or swelling where your finger attaches to the palm of your hand.  A lump in the palm of your hand or on the inside of your finger.  Hearing a popping sound when you try to straighten your finger.  Feeling a popping, catching, or locking sensation when you try to straighten your finger.  Being unable to straighten your finger.  How is this diagnosed? This condition is diagnosed based on your symptoms and a physical exam. How is this treated? This condition may be treated by:  Resting your finger and avoiding activities that make symptoms worse.  Wearing a finger splint to keep your finger in a slightly bent position.  Taking NSAIDs to relieve pain and swelling.  Injecting medicine (steroids) into the tendon sheath to reduce swelling and irritation. Injections may need to be  repeated.  Having surgery to open the tendon sheath. This may be done if other treatments do not work and you cannot straighten your finger. You may need physical therapy after surgery.  Follow these instructions at home:  Use moist heat to help reduce pain and swelling as told by your health care provider.  Rest your finger and avoid activities that make pain worse. Return to normal activities as told by your health care provider.  If you have a splint, wear it as told by your health care provider.  Take over-the-counter and prescription medicines only as told by your health care provider.  Keep all follow-up visits as told by your health care provider. This is important. Contact a health care provider if:  Your symptoms are not improving with home care. Summary  Trigger finger (stenosing tenosynovitis) causes your finger to get stuck in a bent position, and it can make it difficult and painful to straighten your finger.  This condition develops when a finger tendon or tendon sheath thickens.  Treatment starts with resting, wearing a splint, and taking NSAIDs.  In severe cases, surgery to open the tendon sheath may be needed. This information is not intended to replace advice given to you by your health care provider. Make sure you discuss any questions you have with your health care provider. Document Released: 08/18/2004 Document Revised: 10/09/2016 Document Reviewed: 10/09/2016 Elsevier Interactive Patient Education  2017 [employer]  

## 2017-09-06 NOTE — Patient Instructions (Signed)
Arthur Walls  09/06/2017     @PREFPERIOPPHARMACY @   Your procedure is scheduled on 09/12/2017.  Report to [name] at 11:40 A.M.  Call this number if you have problems the morning of surgery:  [phone]   Remember:  Do not eat food or drink liquids after midnight.  Take these medicines the morning of surgery with A SIP OF WATER Zyrtec, Valium, Flonase, Flomax   Do not wear jewelry, make-up or nail polish.  Do not wear lotions, powders, or perfumes, or deoderant.  Do not shave 48 hours prior to surgery.  Men may shave face and neck.  Do not bring valuables to the hospital.  [facility] is not responsible for any belongings or valuables.  Contacts, dentures or bridgework may not be worn into surgery.  Leave your suitcase in the car.  After surgery it may be brought to your room.  For patients admitted to the hospital, discharge time will be determined by your treatment team.  Patients discharged the day of surgery will not be allowed to drive home.    Please read over the following fact sheets that you were given. Anesthesia Post-op Instructions     PATIENT INSTRUCTIONS POST-ANESTHESIA  IMMEDIATELY FOLLOWING SURGERY:  Do not drive or operate machinery for the first twenty four hours after surgery.  Do not make any important decisions for twenty four hours after surgery or while taking narcotic pain medications or sedatives.  If you develop intractable nausea and vomiting or a severe headache please notify your doctor immediately.  FOLLOW-UP:  Please make an appointment with your surgeon as instructed. You do not need to follow up with anesthesia unless specifically instructed to do so.  WOUND CARE INSTRUCTIONS (if applicable):  Keep a dry clean dressing on the anesthesia/puncture wound site if there is drainage.  Once the wound has quit draining you may leave it open to air.  Generally you should leave the bandage intact for twenty four hours unless there is drainage.  If  the epidural site drains for more than 36-48 hours please call the anesthesia department.  QUESTIONS?:  Please feel free to call your physician or the hospital operator if you have any questions, and they will be happy to assist you.      Trigger Finger Trigger finger (stenosing tenosynovitis) is a condition that causes a finger to get stuck in a bent position. Each finger has a tough, cord-like tissue that connects muscle to bone (tendon), and each tendon is surrounded by a tunnel of tissue (tendon sheath). To move your finger, your tendon needs to slide freely through the sheath. Trigger finger happens when the tendon or the sheath thickens, making it difficult to move your finger. Trigger finger can affect any finger or a thumb. It may affect more than one finger. Mild cases may clear up with rest and medicine. Severe cases require more treatment. What are the causes? Trigger finger is caused by a thickened finger tendon or tendon sheath. The cause of this thickening is not known. What increases the risk? The following factors may make you more likely to develop this condition:  Doing activities that require a strong grip.  Having rheumatoid arthritis, gout, or diabetes.  Being 39-16 years old.  Being a woman.  What are the signs or symptoms? Symptoms of this condition include:  Pain when bending or straightening your finger.  Tenderness or swelling where your finger attaches to the palm of your hand.  A lump in  the palm of your hand or on the inside of your finger.  Hearing a popping sound when you try to straighten your finger.  Feeling a popping, catching, or locking sensation when you try to straighten your finger.  Being unable to straighten your finger.  How is this diagnosed? This condition is diagnosed based on your symptoms and a physical exam. How is this treated? This condition may be treated by:  Resting your finger and avoiding activities that make symptoms  worse.  Wearing a finger splint to keep your finger in a slightly bent position.  Taking NSAIDs to relieve pain and swelling.  Injecting medicine (steroids) into the tendon sheath to reduce swelling and irritation. Injections may need to be repeated.  Having surgery to open the tendon sheath. This may be done if other treatments do not work and you cannot straighten your finger. You may need physical therapy after surgery.  Follow these instructions at home:  Use moist heat to help reduce pain and swelling as told by your health care provider.  Rest your finger and avoid activities that make pain worse. Return to normal activities as told by your health care provider.  If you have a splint, wear it as told by your health care provider.  Take over-the-counter and prescription medicines only as told by your health care provider.  Keep all follow-up visits as told by your health care provider. This is important. Contact a health care provider if:  Your symptoms are not improving with home care. Summary  Trigger finger (stenosing tenosynovitis) causes your finger to get stuck in a bent position, and it can make it difficult and painful to straighten your finger.  This condition develops when a finger tendon or tendon sheath thickens.  Treatment starts with resting, wearing a splint, and taking NSAIDs.  In severe cases, surgery to open the tendon sheath may be needed. This information is not intended to replace advice given to you by your health care provider. Make sure you discuss any questions you have with your health care provider. Document Released: 08/18/2004 Document Revised: 10/09/2016 Document Reviewed: 10/09/2016 Elsevier Interactive Patient Education  2017 [employer].

## 2017-09-09 ENCOUNTER — Encounter (HOSPITAL_COMMUNITY)
Admission: RE | Admit: 2017-09-09 | Discharge: 2017-09-09 | Disposition: A | Discharge status: Home / Self Care | Payer: Medicare Other | Source: Ambulatory Visit | Attending: Orthopedic Surgery | Admitting: Orthopedic Surgery

## 2017-09-09 ENCOUNTER — Other Ambulatory Visit: Payer: Self-pay

## 2017-09-09 ENCOUNTER — Encounter (HOSPITAL_COMMUNITY): Payer: Self-pay

## 2017-09-09 DIAGNOSIS — C8590 Non-Hodgkin lymphoma, unspecified, unspecified site: Secondary | ICD-10-CM | POA: Diagnosis not present

## 2017-09-09 DIAGNOSIS — Z79899 Other long term (current) drug therapy: Secondary | ICD-10-CM | POA: Diagnosis not present

## 2017-09-09 DIAGNOSIS — M65332 Trigger finger, left middle finger: Secondary | ICD-10-CM | POA: Diagnosis not present

## 2017-09-09 DIAGNOSIS — E785 Hyperlipidemia, unspecified: Secondary | ICD-10-CM | POA: Diagnosis not present

## 2017-09-09 DIAGNOSIS — Z87891 Personal history of nicotine dependence: Secondary | ICD-10-CM | POA: Diagnosis not present

## 2017-09-09 DIAGNOSIS — M1990 Unspecified osteoarthritis, unspecified site: Secondary | ICD-10-CM | POA: Diagnosis not present

## 2017-09-09 DIAGNOSIS — G2581 Restless legs syndrome: Secondary | ICD-10-CM | POA: Diagnosis not present

## 2017-09-09 HISTORY — DX: Unspecified hearing loss, unspecified ear: H91.90

## 2017-09-09 HISTORY — DX: Benign prostatic hyperplasia without lower urinary tract symptoms: N40.0

## 2017-09-11 NOTE — H&P (Signed)
Patient ID: Arthur Walls, male   DOB: May 27, 1934, 83 y.o.   MRN: [id number]   Chief Complaint  Patient presents with  . Follow-up      Consult left LONG  trigger finger      83 yo male with triggering left long finger with recurrence. He c/o several months of mild pain over the A1 pulley with catching; the pain is constant, dull and non radiating.   Its not better after injection        Review of Systems  HENT: Negative for hearing loss.   Neurological: Positive for tingling.    Current Meds  Medication Sig  . cholecalciferol (VITAMIN D) 1000 units tablet Take 2,000 Units by mouth daily.  . diazepam (VALIUM) 5 MG tablet Take 5 mg by mouth daily.   Marland Kitchen docusate sodium (COLACE) 100 MG capsule Take 100 mg by mouth daily.  . Melatonin 5 MG CAPS Take by mouth daily.  . Multiple Vitamin (MULTIVITAMIN) tablet Take 1 tablet by mouth daily.  . tamsulosin (FLOMAX) 0.4 MG CAPS capsule TAKE 1 CAPSULE BY MOUTH DAILY          Past Medical History:  Diagnosis Date  . Arthritis    . Hyperlipidemia    . Non Hodgkin's lymphoma ([place]) 2012  . Non-Hodgkin lymphoma ([place]) 08/14/2016  . Restless leg syndrome               Allergies  Allergen Reactions  . Silodosin Other (See Comments)      Eyes watery and crust all around the eyes      BP 131/76   Pulse 65   Ht 5\' 6"  (1.676 m)   Wt 149 lb (67.6 kg)   BMI 24.05 kg/m      Physical Exam Gen. appearance the patient's appearance is normal with normal grooming and  hygiene The patient is oriented to person place and time Mood and affect are normal GAIT: NORMAL    Ortho Exam   LEFT HAND: TENDER AT THE PALM OVER THE A1 PULLEY CATCHING OF THE LONG FINGER  NEURO NORMAL SENSATION VASCULAR NORMAL COLOR AND CAP REFILL FLEXOR TENDON STRENGTH NORMAL STABILITY NORMAL  EPITROCHLEA LYMPH NODES NORMAL    Medical decision-making     Encounter Diagnosis  Name Primary?  . Trigger finger, acquired Yes    LEFT LONG FINGER  TRIGGER RELEASE  //[id number]   [name], MD

## 2017-09-11 NOTE — Progress Notes (Signed)
Pt notified to be at hospital at 1020. Voiced understanding.

## 2017-09-12 ENCOUNTER — Encounter (HOSPITAL_COMMUNITY): Payer: Self-pay | Admitting: *Deleted

## 2017-09-12 ENCOUNTER — Ambulatory Visit (HOSPITAL_COMMUNITY): Payer: Medicare Other | Admitting: Anesthesiology

## 2017-09-12 ENCOUNTER — Encounter (HOSPITAL_COMMUNITY)
Admission: RE | Disposition: A | Discharge status: Home / Self Care | Payer: Self-pay | Source: Ambulatory Visit | Attending: Orthopedic Surgery

## 2017-09-12 ENCOUNTER — Ambulatory Visit (HOSPITAL_COMMUNITY)
Admission: RE | Admit: 2017-09-12 | Discharge: 2017-09-12 | Disposition: A | Discharge status: Home / Self Care | Payer: Medicare Other | Source: Ambulatory Visit | Attending: Orthopedic Surgery | Admitting: Orthopedic Surgery

## 2017-09-12 DIAGNOSIS — Z79899 Other long term (current) drug therapy: Secondary | ICD-10-CM | POA: Insufficient documentation

## 2017-09-12 DIAGNOSIS — E785 Hyperlipidemia, unspecified: Secondary | ICD-10-CM | POA: Insufficient documentation

## 2017-09-12 DIAGNOSIS — M1990 Unspecified osteoarthritis, unspecified site: Secondary | ICD-10-CM | POA: Diagnosis not present

## 2017-09-12 DIAGNOSIS — M65332 Trigger finger, left middle finger: Secondary | ICD-10-CM | POA: Insufficient documentation

## 2017-09-12 DIAGNOSIS — C8590 Non-Hodgkin lymphoma, unspecified, unspecified site: Secondary | ICD-10-CM | POA: Insufficient documentation

## 2017-09-12 DIAGNOSIS — G2581 Restless legs syndrome: Secondary | ICD-10-CM | POA: Insufficient documentation

## 2017-09-12 DIAGNOSIS — Z87891 Personal history of nicotine dependence: Secondary | ICD-10-CM | POA: Diagnosis not present

## 2017-09-12 HISTORY — PX: TRIGGER FINGER RELEASE: SHX641

## 2017-09-12 SURGERY — RELEASE, A1 PULLEY, FOR TRIGGER FINGER
Anesthesia: Regional | Site: Finger | Laterality: Left

## 2017-09-12 MED ORDER — FENTANYL CITRATE (PF) 100 MCG/2ML IJ SOLN
INTRAMUSCULAR | Status: AC
  Filled 2017-09-12: qty 2

## 2017-09-12 MED ORDER — FENTANYL CITRATE (PF) 100 MCG/2ML IJ SOLN: 25.0000 ug | INTRAMUSCULAR | Status: DC | PRN

## 2017-09-12 MED ORDER — FENTANYL CITRATE (PF) 100 MCG/2ML IJ SOLN
25.0000 ug | Freq: Once | INTRAMUSCULAR | Status: AC
  Administered 2017-09-12: 25 ug via INTRAVENOUS

## 2017-09-12 MED ORDER — SODIUM CHLORIDE 0.9 % IR SOLN
Status: DC | PRN
  Administered 2017-09-12: 1000 mL

## 2017-09-12 MED ORDER — BUPIVACAINE HCL (PF) 0.5 % IJ SOLN
INTRAMUSCULAR | Status: AC
  Filled 2017-09-12: qty 30

## 2017-09-12 MED ORDER — PROPOFOL 10 MG/ML IV BOLUS
INTRAVENOUS | Status: AC
  Filled 2017-09-12: qty 20

## 2017-09-12 MED ORDER — BUPIVACAINE HCL (PF) 0.5 % IJ SOLN
INTRAMUSCULAR | Status: DC | PRN
  Administered 2017-09-12: 9.5 mL

## 2017-09-12 MED ORDER — ACETAMINOPHEN-CODEINE #3 300-30 MG PO TABS: 1.0000 | ORAL_TABLET | Freq: Four times a day (QID) | ORAL | 0 refills | Status: DC | PRN

## 2017-09-12 MED ORDER — PROPOFOL 500 MG/50ML IV EMUL
INTRAVENOUS | Status: DC | PRN
  Administered 2017-09-12: 40 ug/kg/min via INTRAVENOUS

## 2017-09-12 MED ORDER — MIDAZOLAM HCL 2 MG/2ML IJ SOLN
INTRAMUSCULAR | Status: AC
  Filled 2017-09-12: qty 2

## 2017-09-12 MED ORDER — CEFAZOLIN SODIUM-DEXTROSE 2-4 GM/100ML-% IV SOLN
2.0000 g | INTRAVENOUS | Status: AC
  Administered 2017-09-12: 2 g via INTRAVENOUS
  Filled 2017-09-12: qty 100

## 2017-09-12 MED ORDER — MIDAZOLAM HCL 5 MG/5ML IJ SOLN
INTRAMUSCULAR | Status: DC | PRN
  Administered 2017-09-12: 1 mg via INTRAVENOUS

## 2017-09-12 MED ORDER — LACTATED RINGERS IV SOLN
INTRAVENOUS | Status: DC
  Administered 2017-09-12: 11:00:00 via INTRAVENOUS

## 2017-09-12 MED ORDER — LIDOCAINE HCL (PF) 0.5 % IJ SOLN
INTRAMUSCULAR | Status: DC | PRN
  Administered 2017-09-12: 250 mg via INTRAVENOUS

## 2017-09-12 MED ORDER — MIDAZOLAM HCL 2 MG/2ML IJ SOLN
1.0000 mg | INTRAMUSCULAR | Status: AC
  Administered 2017-09-12: 2 mg via INTRAVENOUS

## 2017-09-12 MED ORDER — FENTANYL CITRATE (PF) 100 MCG/2ML IJ SOLN
INTRAMUSCULAR | Status: DC | PRN
  Administered 2017-09-12: 25 ug via INTRAVENOUS

## 2017-09-12 MED ORDER — CHLORHEXIDINE GLUCONATE 4 % EX LIQD: 60.0000 mL | Freq: Once | CUTANEOUS | Status: DC

## 2017-09-12 SURGICAL SUPPLY — 39 items
BAG HAMPER (MISCELLANEOUS) ×3 IMPLANT
BANDAGE ELASTIC 2 LF NS (GAUZE/BANDAGES/DRESSINGS) ×3 IMPLANT
BANDAGE ESMARK 4X12 BL STRL LF (DISPOSABLE) ×1 IMPLANT
BLADE SURG 15 STRL LF DISP TIS (BLADE) ×1 IMPLANT
BLADE SURG 15 STRL SS (BLADE) ×2
BNDG CONFORM 2 STRL LF (GAUZE/BANDAGES/DRESSINGS) ×3 IMPLANT
BNDG ESMARK 4X12 BLUE STRL LF (DISPOSABLE) ×3
CHLORAPREP W/TINT 26ML (MISCELLANEOUS) ×3 IMPLANT
CLOTH BEACON ORANGE TIMEOUT ST (SAFETY) ×3 IMPLANT
COVER LIGHT HANDLE STERIS (MISCELLANEOUS) ×6 IMPLANT
CUFF TOURNIQUET SINGLE 18IN (TOURNIQUET CUFF) ×3 IMPLANT
DECANTER SPIKE VIAL GLASS SM (MISCELLANEOUS) ×3 IMPLANT
DRAPE HALF SHEET 40X57 (DRAPES) ×3 IMPLANT
DRSG XEROFORM 1X8 (GAUZE/BANDAGES/DRESSINGS) ×3 IMPLANT
ELECT NEEDLE TIP 2.8 STRL (NEEDLE) ×3 IMPLANT
ELECT REM PT RETURN 9FT ADLT (ELECTROSURGICAL) ×3
ELECTRODE REM PT RTRN 9FT ADLT (ELECTROSURGICAL) ×1 IMPLANT
GAUZE SPONGE 4X4 12PLY STRL (GAUZE/BANDAGES/DRESSINGS) ×3 IMPLANT
GAUZE XEROFORM 1X8 LF (GAUZE/BANDAGES/DRESSINGS) ×3 IMPLANT
GLOVE BIOGEL PI IND STRL 7.0 (GLOVE) ×2 IMPLANT
GLOVE BIOGEL PI INDICATOR 7.0 (GLOVE) ×4
GLOVE ECLIPSE 6.5 STRL STRAW (GLOVE) ×3 IMPLANT
GLOVE SKINSENSE NS SZ8.0 LF (GLOVE) ×2
GLOVE SKINSENSE STRL SZ8.0 LF (GLOVE) ×1 IMPLANT
GLOVE SS N UNI LF 8.5 STRL (GLOVE) ×3 IMPLANT
GOWN STRL REUS W/TWL LRG LVL3 (GOWN DISPOSABLE) ×3 IMPLANT
GOWN STRL REUS W/TWL XL LVL3 (GOWN DISPOSABLE) ×3 IMPLANT
HAND ALUMI XLG (SOFTGOODS) ×3 IMPLANT
KIT ROOM TURNOVER APOR (KITS) ×3 IMPLANT
MANIFOLD NEPTUNE II (INSTRUMENTS) ×3 IMPLANT
NEEDLE HYPO 21X1.5 SAFETY (NEEDLE) ×3 IMPLANT
NS IRRIG 1000ML POUR BTL (IV SOLUTION) ×3 IMPLANT
PACK BASIC LIMB (CUSTOM PROCEDURE TRAY) ×3 IMPLANT
PAD ARMBOARD 7.5X6 YLW CONV (MISCELLANEOUS) ×3 IMPLANT
SET BASIN LINEN APH (SET/KITS/TRAYS/PACK) ×3 IMPLANT
SPONGE GAUZE 2X2 8PLY STER LF (GAUZE/BANDAGES/DRESSINGS) ×1
SPONGE GAUZE 2X2 8PLY STRL LF (GAUZE/BANDAGES/DRESSINGS) ×2 IMPLANT
SUT ETHILON 3 0 FSL (SUTURE) ×3 IMPLANT
SYR CONTROL 10ML LL (SYRINGE) ×3 IMPLANT

## 2017-09-12 NOTE — Interval H&P Note (Signed)
History and Physical Interval Note:  09/12/2017 11:29 AM  Arthur Walls  has presented today for surgery, with the diagnosis of Left Long Finger Pain and Catching  The various methods of treatment have been discussed with the patient and family. After consideration of risks, benefits and other options for treatment, the patient has consented to  Procedure(s): LEFT LONG FINGER TRIGGER RELEASE (Left) as a surgical intervention .  The patient's history has been reviewed, patient examined, no change in status, stable for surgery.  I have reviewed the patient's chart and labs.  Questions were answered to the patient's satisfaction.     [name]

## 2017-09-12 NOTE — Op Note (Signed)
09/12/2017  12:14 PM  PATIENT:  Arthur Walls  83 y.o. male  PRE-OPERATIVE DIAGNOSIS:  Left Long Finger triggering   POST-OPERATIVE DIAGNOSIS:  Left Long Finger triggering  PROCEDURE:  Procedure(s): LEFT LONG FINGER TRIGGER RELEASE (Left)   OPERATIVE REPORT   09/12/2017 12:14 PM Arthur Abbott, MD   Preop diagnosis trigger finger left long finger Postop diagnosis same   Procedure release A1 pulley left long finger Surgeon Aline Brochure Anesthesia Bier block Operation findings: Stenosing tenosynovitis flexor tendon A1 pulley No assistants Counts were correct Clean case no specimen 10 mL of Marcaine with epinephrine injected after the case Patient to recovery room patient's stable condition  The procedure was performed as follows  The patient was identified in the preop area and the surgical site was confirmed and marked, chart update was completed patient taken to surgery given appropriate antibiotics based on her allergy profile  After successful Bier block in sterile prep and drape timeout was completed  A transverse incision was made over the A1 pulley of the left long finger finger subcutaneous tissue was divided blunt dissection was carried out to protect neurovascular structures. A blunt instrument was placed underneath the A1 pulley and the A1 pulley was released. Flexion extension of the digit confirmed removal of mechanical block. Wound was irrigated and closed with 3-0 nylon suture  We took the patient to recovery room in stable condition  Arthur Abbott, MD    SURGEON:  Surgeon(s) and Role:    * Carole Civil, MD - Primary  PHYSICIAN ASSISTANT:   ASSISTANTS: none   ANESTHESIA:   regional  EBL:  0 mL   BLOOD ADMINISTERED:none  DRAINS: none   LOCAL MEDICATIONS USED:  MARCAINE     SPECIMEN:  No Specimen  DISPOSITION OF SPECIMEN:  N/A  COUNTS:  YES  TOURNIQUET:  * Missing tourniquet times found for documented tourniquets in log:  [id number]  *  DICTATION: .Dragon Dictation  PLAN OF CARE: Discharge to home after PACU  PATIENT DISPOSITION:  PACU - hemodynamically stable.   Delay start of Pharmacological VTE agent (>24hrs) due to surgical blood loss or risk of bleeding: not applicable

## 2017-09-12 NOTE — Anesthesia Postprocedure Evaluation (Signed)
Anesthesia Post Note  Patient: Arthur Walls  Procedure(s) Performed: LEFT LONG FINGER TRIGGER RELEASE (Left Finger)  Patient location during evaluation: PACU Anesthesia Type: Bier Block Level of consciousness: awake and patient cooperative Pain management: pain level controlled Vital Signs Assessment: post-procedure vital signs reviewed and stable Respiratory status: spontaneous breathing, nonlabored ventilation and respiratory function stable Cardiovascular status: blood pressure returned to baseline Postop Assessment: no apparent nausea or vomiting Anesthetic complications: no     Last Vitals:  Vitals:   09/12/17 1130 09/12/17 1219  BP:  131/69  Pulse:    Resp: 12 14  Temp:  36.7 C  SpO2: 98% 100%    Last Pain:  Vitals:   09/12/17 1052  TempSrc: Oral                 [name]

## 2017-09-12 NOTE — Anesthesia Preprocedure Evaluation (Signed)
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  Dental  (+) Teeth Intact   Pulmonary former smoker,    breath sounds clear to auscultation       Cardiovascular negative cardio ROS   Rhythm:Regular Rate:Normal     Neuro/Psych negative psych ROS   GI/Hepatic negative GI ROS, Neg liver ROS,   Endo/Other    Renal/GU negative Renal ROS     Musculoskeletal  (+) Arthritis ,   Abdominal   Peds  Hematology Non-Hodgkin lymphoma    Anesthesia Other Findings   Reproductive/Obstetrics                             Anesthesia Physical Anesthesia Plan  ASA: II  Anesthesia Plan: Bier Block   Post-op Pain Management:    Induction: Intravenous  PONV Risk Score and Plan:   Airway Management Planned: Simple Face Mask  Additional Equipment:   Intra-op Plan:   Post-operative Plan:   Informed Consent: I have reviewed the patients History and Physical, chart, labs and discussed the procedure including the risks, benefits and alternatives for the proposed anesthesia with the patient or authorized representative who has indicated his/her understanding and acceptance.     Plan Discussed with:   Anesthesia Plan Comments:         Anesthesia Quick Evaluation

## 2017-09-12 NOTE — Brief Op Note (Signed)
09/12/2017  12:14 PM  PATIENT:  Arthur Walls  83 y.o. male  PRE-OPERATIVE DIAGNOSIS:  Left Long Finger triggering   POST-OPERATIVE DIAGNOSIS:  Left Long Finger triggering  PROCEDURE:  Procedure(s): LEFT LONG FINGER TRIGGER RELEASE (Left)   OPERATIVE REPORT   09/12/2017 12:14 PM [name], MD   Preop diagnosis trigger finger left long finger Postop diagnosis same  Procedure release A1 pulley left long finger Surgeon Aline Brochure Anesthesia Bier block Operation findings: Stenosing tenosynovitis flexor tendon A1 pulley No assistants Counts were correct Clean case no specimen 10 mL of Marcaine with epinephrine injected after the case Patient to recovery room patient's stable condition  The procedure was performed as follows  The patient was identified in the preop area and the surgical site was confirmed and marked, chart update was completed patient taken to surgery given appropriate antibiotics based on her allergy profile  After successful Bier block in sterile prep and drape timeout was completed  A transverse incision was made over the A1 pulley of the left long finger finger subcutaneous tissue was divided blunt dissection was carried out to protect neurovascular structures. A blunt instrument was placed underneath the A1 pulley and the A1 pulley was released. Flexion extension of the digit confirmed removal of mechanical block. Wound was irrigated and closed with 3-0 nylon suture  We took the patient to recovery room in stable condition  [name], MD    SURGEON:  Surgeon(s) and Role:    * Carole Civil, MD - Primary  PHYSICIAN ASSISTANT:   ASSISTANTS: none   ANESTHESIA:   regional  EBL:  0 mL   BLOOD ADMINISTERED:none  DRAINS: none   LOCAL MEDICATIONS USED:  MARCAINE     SPECIMEN:  No Specimen  DISPOSITION OF SPECIMEN:  N/A  COUNTS:  YES  TOURNIQUET:  * Missing tourniquet times found for documented tourniquets in log:  [id number]  *  DICTATION: .Dragon Dictation  PLAN OF CARE: Discharge to home after PACU  PATIENT DISPOSITION:  PACU - hemodynamically stable.   Delay start of Pharmacological VTE agent (>24hrs) due to surgical blood loss or risk of bleeding: not applicable  [id number]

## 2017-09-12 NOTE — Anesthesia Procedure Notes (Signed)
Anesthesia Regional Block: Bier block (IV Regional)   Pre-Anesthetic Checklist: ,, timeout performed, Correct Patient, Correct Site, Correct Laterality, Correct Procedure,, site marked, surgical consent, pre-op evaluation, at surgeon's request  Laterality: Left  Prep: Betadine       Needles:  Injection technique: Single-shot  Needle Type: Other      Needle Gauge: 20     Additional Needles:   Procedures:,,,,, intact distal pulses, Esmarch exsanguination, single tourniquet utilized,   Nerve Stimulator or Paresthesia:   Additional Responses:  Pulse checked post tourniquet inflation. IV NSL discontinued post injection. Narrative:  Start time: 09/12/2017 11:43 AM  Performed by: Personally  CRNA: Charmaine Downs

## 2017-09-12 NOTE — Discharge Instructions (Signed)
Incision and Drainage, Care After Refer to this sheet in the next few weeks. These instructions provide you with information about caring for yourself after your procedure. Your health care provider may also give you more specific instructions. Your treatment has been planned according to current medical practices, but problems sometimes occur. Call your health care provider if you have any problems or questions after your procedure. What can I expect after the procedure? After the procedure, it is common to have:  Pain or discomfort around your incision site.  Drainage from your incision.  Follow these instructions at home:  Take over-the-counter and prescription medicines only as told by your health care provider.  If you were prescribed an antibiotic medicine, take it as told by your health care provider.Do not stop taking the antibiotic even if you start to feel better.  Followinstructions from your health care provider about: ? How to take care of your incision. ? When and how you should change your packing and bandage (dressing). Wash your hands with soap and water before you change your dressing. If soap and water are not available, use hand sanitizer. ? When you should remove your dressing.  Do not take baths, swim, or use a hot tub until your health care provider approves.  Keep all follow-up visits as told by your health care provider. This is important.  Check your incision area every day for signs of infection. Check for: ? More redness, swelling, or pain. ? More fluid or blood. ? Warmth. ? Pus or a bad smell. Contact a health care provider if:  Your cyst or abscess returns.  You have a fever.  You have more redness, swelling, or pain around your incision.  You have more fluid or blood coming from your incision.  Your incision feels warm to the touch.  You have pus or a bad smell coming from your incision. Get help right away if:  You have severe pain or  bleeding.  You cannot eat or drink without vomiting.  You have decreased urine output.  You become short of breath.  You have chest pain.  You cough up blood.  The area where the incision and drainage occurred becomes numb or it tingles. This information is not intended to replace advice given to you by your health care provider. Make sure you discuss any questions you have with your health care provider. Document Released: 01/21/2012 Document Revised: 03/30/2016 Document Reviewed: 08/19/2015 Elsevier Interactive Patient Education  2017 [employer], Care After These instructions provide you with information about caring for yourself after your procedure. Your health care provider may also give you more specific instructions. Your treatment has been planned according to current medical practices, but problems sometimes occur. Call your health care provider if you have any problems or questions after your procedure. What can I expect after the procedure? After your procedure, it is common to:  Feel sleepy for several hours.  Feel clumsy and have poor balance for several hours.  Feel forgetful about what happened after the procedure.  Have poor judgment for several hours.  Feel nauseous or vomit.  Have a sore throat if you had a breathing tube during the procedure.  Follow these instructions at home: For at least 24 hours after the procedure:   Do not: ? Participate in activities in which you could fall or become injured. ? Drive. ? Use heavy machinery. ? Drink alcohol. ? Take sleeping pills or medicines that cause  drowsiness. ? Make important decisions or sign legal documents. ? Take care of children on your own.  Rest. Eating and drinking  Follow the diet that is recommended by your health care provider.  If you vomit, drink water, juice, or soup when you can drink without vomiting.  Make sure you have little or no nausea before  eating solid foods. General instructions  Have a responsible adult stay with you until you are awake and alert.  Take over-the-counter and prescription medicines only as told by your health care provider.  If you smoke, do not smoke without supervision.  Keep all follow-up visits as told by your health care provider. This is important. Contact a health care provider if:  You keep feeling nauseous or you keep vomiting.  You feel light-headed.  You develop a rash.  You have a fever. Get help right away if:  You have trouble breathing. This information is not intended to replace advice given to you by your health care provider. Make sure you discuss any questions you have with your health care provider. Document Released: 02/19/2016 Document Revised: 06/20/2016 Document Reviewed: 02/19/2016 Elsevier Interactive Patient Education  [employer].

## 2017-09-12 NOTE — Transfer of Care (Signed)
Immediate Anesthesia Transfer of Care Note  Patient: Arthur Walls  Procedure(s) Performed: LEFT LONG FINGER TRIGGER RELEASE (Left Finger)  Patient Location: PACU  Anesthesia Type:Bier block  Level of Consciousness: awake and patient cooperative  Airway & Oxygen Therapy: Patient Spontanous Breathing and Patient connected to face mask oxygen  Post-op Assessment: Report given to RN, Post -op Vital signs reviewed and stable and Patient moving all extremities  Post vital signs: Reviewed and stable  Last Vitals:  Vitals:   09/12/17 1125 09/12/17 1130  BP:    Pulse:    Resp: (!) 24 12  Temp:    SpO2: 99% 98%    Last Pain:  Vitals:   09/12/17 1052  TempSrc: Oral      Patients Stated Pain Goal: 4 (83/46/21 9471)  Complications: No apparent anesthesia complications

## 2017-09-16 ENCOUNTER — Encounter (HOSPITAL_COMMUNITY): Payer: Self-pay | Admitting: Orthopedic Surgery

## 2017-09-16 DIAGNOSIS — N401 Enlarged prostate with lower urinary tract symptoms: Secondary | ICD-10-CM | POA: Diagnosis not present

## 2017-09-16 DIAGNOSIS — R972 Elevated prostate specific antigen [PSA]: Secondary | ICD-10-CM | POA: Diagnosis not present

## 2017-09-17 ENCOUNTER — Ambulatory Visit: Payer: Medicare Other | Admitting: Orthopedic Surgery

## 2017-09-18 ENCOUNTER — Encounter: Payer: Self-pay | Admitting: Orthopedic Surgery

## 2017-09-18 ENCOUNTER — Ambulatory Visit (INDEPENDENT_AMBULATORY_CARE_PROVIDER_SITE_OTHER): Payer: Self-pay | Admitting: Orthopedic Surgery

## 2017-09-18 VITALS — BP 143/74 | HR 61 | Ht 66.0 in | Wt 149.0 lb

## 2017-09-18 DIAGNOSIS — Z4889 Encounter for other specified surgical aftercare: Secondary | ICD-10-CM

## 2017-09-18 NOTE — Progress Notes (Signed)
Chief Complaint  Patient presents with  . Routine Post Op    09/12/17 trigger finger release left middle     BP (!) 143/74   Pulse 61   Ht 5\' 6"  (1.676 m)   Wt 149 lb (67.6 kg)   BMI 24.05 kg/m   He had a trigger release on the left it looks good clean the wound up placed a Band-Aid he can take a shower with the glove on.  Come back on the 14th get the sutures out continue active range of motion exercises

## 2017-09-24 DIAGNOSIS — Z9889 Other specified postprocedural states: Secondary | ICD-10-CM | POA: Insufficient documentation

## 2017-09-25 ENCOUNTER — Ambulatory Visit (INDEPENDENT_AMBULATORY_CARE_PROVIDER_SITE_OTHER): Payer: Medicare Other | Admitting: Orthopedic Surgery

## 2017-09-25 VITALS — BP 113/71 | HR 54 | Ht 66.0 in | Wt 149.0 lb

## 2017-09-25 DIAGNOSIS — Z9889 Other specified postprocedural states: Secondary | ICD-10-CM

## 2017-09-25 NOTE — Progress Notes (Signed)
Chief Complaint  Patient presents with  . Follow-up    Recheck and take sutures out, DOS 09-12-17, left hand.    Encounter Diagnosis  Name Primary?  . S/P trigger finger release left middle 09/12/17 Yes    Wound looks clean after suture removal patient has no catching or locking and has 95% of his range of motion returned  Discharge

## 2017-10-25 ENCOUNTER — Ambulatory Visit (INDEPENDENT_AMBULATORY_CARE_PROVIDER_SITE_OTHER): Payer: Self-pay | Admitting: Orthopedic Surgery

## 2017-10-25 VITALS — BP 114/65 | HR 60 | Ht 66.5 in | Wt 146.0 lb

## 2017-10-25 DIAGNOSIS — M65332 Trigger finger, left middle finger: Secondary | ICD-10-CM

## 2017-10-25 DIAGNOSIS — Z4889 Encounter for other specified surgical aftercare: Secondary | ICD-10-CM

## 2017-10-25 NOTE — Progress Notes (Signed)
POST OP VISIT   Patient ID: Arthur Walls, male   DOB: 1934-07-28, 83 y.o.   MRN: [id number]  Chief Complaint  Patient presents with  . Follow-up    Recheck on left middle finger, DOS 09-12-17.    POD 43  Complains of pain when he extends the left long finger.  No catching or locking.  I showed him some stretching exercises to do.  He does have some tenderness over the surgical site but no numbness or tingling and he does not have any catching or locking  He will follow-up in 4 weeks  Encounter Diagnoses  Name Primary?  Marland Kitchen Aftercare following surgery Yes  . Trigger middle finger of left hand

## 2017-11-19 DIAGNOSIS — N401 Enlarged prostate with lower urinary tract symptoms: Secondary | ICD-10-CM | POA: Diagnosis not present

## 2017-11-19 DIAGNOSIS — G2581 Restless legs syndrome: Secondary | ICD-10-CM | POA: Diagnosis not present

## 2017-11-19 DIAGNOSIS — J209 Acute bronchitis, unspecified: Secondary | ICD-10-CM | POA: Diagnosis not present

## 2017-11-19 DIAGNOSIS — L858 Other specified epidermal thickening: Secondary | ICD-10-CM | POA: Diagnosis not present

## 2017-11-25 ENCOUNTER — Ambulatory Visit: Payer: Medicare Other | Admitting: Orthopedic Surgery

## 2018-01-30 ENCOUNTER — Encounter: Payer: Self-pay | Admitting: Orthopedic Surgery

## 2018-01-30 ENCOUNTER — Ambulatory Visit (INDEPENDENT_AMBULATORY_CARE_PROVIDER_SITE_OTHER): Payer: Medicare Other | Admitting: Orthopedic Surgery

## 2018-01-30 VITALS — BP 132/80 | HR 62 | Ht 66.5 in | Wt 151.0 lb

## 2018-01-30 DIAGNOSIS — Z9889 Other specified postprocedural states: Secondary | ICD-10-CM

## 2018-01-30 DIAGNOSIS — M19041 Primary osteoarthritis, right hand: Secondary | ICD-10-CM | POA: Diagnosis not present

## 2018-01-30 MED ORDER — NAPROXEN 500 MG PO TABS: 500.0000 mg | ORAL_TABLET | Freq: Two times a day (BID) | ORAL | 2 refills | Status: DC

## 2018-01-30 NOTE — Progress Notes (Signed)
Progress Note   Patient ID: Arthur Walls, male   DOB: [birth year], 84 y.o.   MRN: [id number]  Chief Complaint  Patient presents with  . Hand Pain    had left middle finger trigger finger release 09/12/17 still has pain / stiffness    84 year old male had a left long finger release for triggering on September 12, 2017 complains of palmar tenderness and PIP joint pain.      Review of Systems  Skin: Negative.   Neurological: Negative for tingling.   Current Meds  Medication Sig  . cetirizine (ZYRTEC) 10 MG chewable tablet Chew 10 mg by mouth daily.  . Cholecalciferol (VITAMIN D) 2000 units CAPS Take 2,000 Units by mouth daily.  . diazepam (VALIUM) 5 MG tablet Take 5 mg by mouth daily.   Marland Kitchen docusate sodium (COLACE) 100 MG capsule Take 100 mg by mouth daily.  Marland Kitchen doxycycline (VIBRAMYCIN) 100 MG capsule Take 100 mg by mouth 2 (two) times daily.  Marland Kitchen FIBER PO Take 1 tablet by mouth daily.  . fluticasone (FLONASE) 50 MCG/ACT nasal spray Place 1 spray into both nostrils daily as needed for allergies or rhinitis.  . Melatonin 5 MG CAPS Take 5 mg by mouth at bedtime.   . Multiple Vitamin (MULTIVITAMIN) tablet Take 1 tablet by mouth daily.  Marland Kitchen neomycin-bacitracin-polymyxin (NEOSPORIN) ointment Apply 1 application topically as needed for wound care. apply to eye  . Omega-3 Fatty Acids (FISH OIL PO) Take 1,040 mg by mouth daily.  . tamsulosin (FLOMAX) 0.4 MG CAPS capsule TAKE 1 CAPSULE BY MOUTH DAILY    Allergies  Allergen Reactions  . Silodosin Other (See Comments)    Eyes watery and crust all around the eyes     BP 132/80   Pulse 62   Ht 5' 6.5" (1.689 m)   Wt 151 lb (68.5 kg)   BMI 24.01 kg/m   Physical Exam  Constitutional: He is oriented to person, place, and time. He appears well-developed and well-nourished.  Vital signs have been reviewed and are stable. Gen. appearance the patient is well-developed and well-nourished with normal grooming and hygiene.   Musculoskeletal:        Hands: Neurological: He is alert and oriented to person, place, and time.  Skin: Skin is warm and dry. No erythema.  Psychiatric: He has a normal mood and affect.  Vitals reviewed.    Medical decision-making Encounter Diagnoses  Name Primary?  . S/P trigger finger release left middle 09/12/17   . Arthritis of finger of right hand Yes      Meds ordered this encounter  Medications  . naproxen (NAPROSYN) 500 MG tablet    Sig: Take 1 tablet (500 mg total) by mouth 2 (two) times daily with a meal.    Dispense:  60 tablet    Refill:  2   Follow-up as needed  [name], MD 01/30/2018 3:32 PM

## 2018-02-04 ENCOUNTER — Other Ambulatory Visit (HOSPITAL_COMMUNITY): Payer: Self-pay | Admitting: *Deleted

## 2018-02-04 DIAGNOSIS — C8217 Follicular lymphoma grade II, spleen: Secondary | ICD-10-CM

## 2018-02-05 ENCOUNTER — Inpatient Hospital Stay (HOSPITAL_COMMUNITY): Payer: Medicare Other | Attending: Internal Medicine

## 2018-02-05 DIAGNOSIS — C8217 Follicular lymphoma grade II, spleen: Secondary | ICD-10-CM | POA: Diagnosis not present

## 2018-02-05 LAB — CBC WITH DIFFERENTIAL/PLATELET
BASOS ABS: 0 10*3/uL (ref 0.0–0.1)
BASOS PCT: 0 %
Eosinophils Absolute: 0.1 10*3/uL (ref 0.0–0.7)
Eosinophils Relative: 1 %
HEMATOCRIT: 50.7 % (ref 39.0–52.0)
Hemoglobin: 16.5 g/dL (ref 13.0–17.0)
LYMPHS PCT: 26 %
Lymphs Abs: 1.5 10*3/uL (ref 0.7–4.0)
MCH: 32.5 pg (ref 26.0–34.0)
MCHC: 32.5 g/dL (ref 30.0–36.0)
MCV: 100 fL (ref 78.0–100.0)
Monocytes Absolute: 0.5 10*3/uL (ref 0.1–1.0)
Monocytes Relative: 9 %
NEUTROS ABS: 3.7 10*3/uL (ref 1.7–7.7)
Neutrophils Relative %: 64 %
PLATELETS: 127 10*3/uL — AB (ref 150–400)
RBC: 5.07 MIL/uL (ref 4.22–5.81)
RDW: 14 % (ref 11.5–15.5)
WBC: 5.8 10*3/uL (ref 4.0–10.5)

## 2018-02-05 LAB — COMPREHENSIVE METABOLIC PANEL
ALBUMIN: 4.1 g/dL (ref 3.5–5.0)
ALK PHOS: 63 U/L (ref 38–126)
ALT: 29 U/L (ref 17–63)
AST: 31 U/L (ref 15–41)
Anion gap: 10 (ref 5–15)
BILIRUBIN TOTAL: 1.2 mg/dL (ref 0.3–1.2)
BUN: 21 mg/dL — AB (ref 6–20)
CALCIUM: 10 mg/dL (ref 8.9–10.3)
CO2: 26 mmol/L (ref 22–32)
CREATININE: 1.37 mg/dL — AB (ref 0.61–1.24)
Chloride: 105 mmol/L (ref 101–111)
GFR calc Af Amer: 54 mL/min — ABNORMAL LOW (ref >60)
GFR calc non Af Amer: 46 mL/min — ABNORMAL LOW (ref >60)
GLUCOSE: 88 mg/dL (ref 65–99)
Potassium: 5.3 mmol/L — ABNORMAL HIGH (ref 3.5–5.1)
SODIUM: 141 mmol/L (ref 135–145)
TOTAL PROTEIN: 7.2 g/dL (ref 6.5–8.1)

## 2018-02-05 LAB — LACTATE DEHYDROGENASE: LDH: 158 U/L (ref 98–192)

## 2018-02-05 LAB — TSH: TSH: 3.239 u[IU]/mL (ref 0.350–4.500)

## 2018-02-12 ENCOUNTER — Encounter (HOSPITAL_COMMUNITY): Payer: Self-pay | Admitting: Internal Medicine

## 2018-02-12 ENCOUNTER — Inpatient Hospital Stay (HOSPITAL_COMMUNITY): Payer: Medicare Other | Attending: Internal Medicine | Admitting: Internal Medicine

## 2018-02-12 ENCOUNTER — Other Ambulatory Visit: Payer: Self-pay

## 2018-02-12 VITALS — BP 140/65 | HR 59 | Temp 97.6°F | Resp 18 | Wt 153.3 lb

## 2018-02-12 DIAGNOSIS — E785 Hyperlipidemia, unspecified: Secondary | ICD-10-CM | POA: Diagnosis not present

## 2018-02-12 DIAGNOSIS — Z9221 Personal history of antineoplastic chemotherapy: Secondary | ICD-10-CM | POA: Insufficient documentation

## 2018-02-12 DIAGNOSIS — G2581 Restless legs syndrome: Secondary | ICD-10-CM | POA: Insufficient documentation

## 2018-02-12 DIAGNOSIS — N400 Enlarged prostate without lower urinary tract symptoms: Secondary | ICD-10-CM | POA: Insufficient documentation

## 2018-02-12 DIAGNOSIS — N289 Disorder of kidney and ureter, unspecified: Secondary | ICD-10-CM | POA: Insufficient documentation

## 2018-02-12 DIAGNOSIS — Z87891 Personal history of nicotine dependence: Secondary | ICD-10-CM | POA: Diagnosis not present

## 2018-02-12 DIAGNOSIS — Z79899 Other long term (current) drug therapy: Secondary | ICD-10-CM | POA: Insufficient documentation

## 2018-02-12 DIAGNOSIS — E875 Hyperkalemia: Secondary | ICD-10-CM | POA: Diagnosis not present

## 2018-02-12 DIAGNOSIS — C8217 Follicular lymphoma grade II, spleen: Secondary | ICD-10-CM

## 2018-02-12 DIAGNOSIS — D696 Thrombocytopenia, unspecified: Secondary | ICD-10-CM | POA: Insufficient documentation

## 2018-02-12 DIAGNOSIS — Z923 Personal history of irradiation: Secondary | ICD-10-CM | POA: Diagnosis not present

## 2018-02-12 DIAGNOSIS — C8580 Other specified types of non-Hodgkin lymphoma, unspecified site: Secondary | ICD-10-CM

## 2018-02-12 NOTE — Patient Instructions (Signed)
[place] [place] at [facility] Discharge Instructions  Follow up in a year with labs Seen by Dr. Walden Field today.   Thank you for choosing [place] at [facility] to provide your oncology and hematology care.  To afford each patient quality time with our provider, please arrive at least 15 minutes before your scheduled appointment time.   If you have a lab appointment with the [facility] please come in thru the  Main Entrance and check in at the main information desk  You need to re-schedule your appointment should you arrive 10 or more minutes late.  We strive to give you quality time with our providers, and arriving late affects you and other patients whose appointments are after yours.  Also, if you no show three or more times for appointments you may be dismissed from the clinic at the providers discretion.     Again, thank you for choosing [facility].  Our hope is that these requests will decrease the amount of time that you wait before being seen by our physicians.       _____________________________________________________________  Should you have questions after your visit to [facility], please contact our office at [phone] between the hours of 8:30 a.m. and 4:30 p.m.  Voicemails left after 4:30 p.m. will not be returned until the following business day.  For prescription refill requests, have your pharmacy contact our office.       Resources For Cancer Patients and their Caregivers ? American Cancer Society: Can assist with transportation, wigs, general needs, runs Look Good Feel Better.        651-036-3118 ? Cancer Care: Provides financial assistance, online support groups, medication/co-pay assistance.  1-800-813-HOPE 212-043-9069) ? [facility] Assists [place] cancer patients and their families through emotional , educational and financial support.  306-393-4799 ? [place] DSS Where to apply for food stamps, Medicaid and utility assistance. 339-849-9113 ? [facility]: Transportation to medical appointments. 5055491892 ? Social Security Administration: May apply for disability if have a Stage IV cancer. 754-234-3534 239-052-0896 ? [employer], Disability and Transit Services: Assists with nutrition, care and transit needs. [place] Support Programs:   > Cancer Support Group  2nd Tuesday of the month 1pm-2pm, Journey Room   > Creative Journey  3rd Tuesday of the month 1130am-1pm, Journey Room

## 2018-02-12 NOTE — Progress Notes (Signed)
Diagnosis Other specified type of non-Hodgkin lymphoma, unspecified body region [facility]) - Plan: CBC with Differential/Platelet, Comprehensive metabolic panel, Lactate dehydrogenase, Beta 2 microglobulin, serum  Staging Cancer Staging Non-Hodgkin lymphoma [facility]) Staging form: Lymphoid Neoplasms, AJCC 6th Edition - Clinical stage from 03/13/2010: Stage IE (lymphoma only) - Signed by [facility], PA-C on 08/14/2016   Assessment and Plan:  1.  Stage IElow-grade follicular non-Hodgkin's lymphoma S/Pneedle biopsy of a paraspinous mass on 03/13/2010 with a 2 year history of difficulty using his left arm with discomfort in the T1 distribution. At the time of diagnosis he had a 6.5 cm mass in the paraspinous area. Further staging study showed no evidence of disease elsewhere including PET scan.  Labs done 02/05/2018 show white count 5.8 hemoglobin 16 platelets 127,000.  LDH is normal at 158.  TSH is normal at 3.2.  Creatinine is 1.4, potassium 5.3.  The patient is 8 years out from his diagnosis and treatment.  He will return to clinic in 1 year for follow-up and repeat labs.  He is asymptomatic.  He should notify the office if he has any problems prior to his next visit.  2.  Renal insufficiency.  It was noted his creatinine was 1.4 potassium was 5.3.  He reports he was recently started on a new medication by his PCP and will see him next week to discuss this.  He also was given option of nephrology referral but reports he will follow with his PCP.  3.  Hyperkalemia.  It was noted his potassium was slightly elevated at 5.3.  Creatinine was 1.4.  He is not on potassium supplementation.  He will follow-up with his PCP for repeat lab evaluation.    4.  Thrombocytopenia.  Platelet count is 127,000.  This is stable with labs dating back to 2018 when platelet count was 124,000.  Interval History:  84 y.o. male returns for followup of Stage IE low-grade follicular non-Hodgkin's lymphoma S/P needle biopsy of a  paraspinous mass on 03/13/2010 with a 2 year history of difficulty using his left arm with discomfort in the T1 distribution. At the time of diagnosis he had a 6.5 cm mass in the paraspinous area. Further staging study showed no evidence of disease elsewhere including PET scan. Treatment as below.   Current Status:  Pt is seen today for follow-up.  He is doing well denies any fevers, chills, night sweats, anorexia, fatigue and has noted no lymphadenopathy.  He is here to go over lab studies.     Non-Hodgkin lymphoma ([place])   03/13/2010 Initial Diagnosis    Non-Hodgkin lymphoma ([place]), biopsy of paraspinous mass in the cervical area      03/13/2010 - 04/14/2010 Radiation Therapy    Radiation delivered to 6.5 cm mass at the T1 level, 30 Gy.      04/25/2010 - 06/11/2011 Chemotherapy    Rituxan 375 mg/m2 initiated weekly x 4 followed by 6 doses of maintenance Rituxan given every 3 months for 1.5 years.       12/12/2011 PET scan    No findings to suggest residual disease on today's examination. Specifically, no mass or hypermetabolic activity noted around the T1 and T2 neural foramina (where the patient's primary lesion was first discovered in February 2011).      08/14/2013 PET scan    No evidence of lymphomatous involvement.        Problem List Patient Active Problem List   Diagnosis Date Noted  . S/P trigger finger release left  middle 09/12/17 [Z98.890] 09/24/2017  . Non-Hodgkin lymphoma ([place]) [C85.90] 08/14/2016    Past Medical History Past Medical History:  Diagnosis Date  . Arthritis   . BPH (benign prostatic hyperplasia)   . HOH (hard of hearing)   . Hyperlipidemia   . Non Hodgkin's lymphoma ([place]) 2012  . Non-Hodgkin lymphoma ([place]) 08/14/2016  . Restless leg syndrome     Past Surgical History Past Surgical History:  Procedure Laterality Date  . ELBOW SURGERY  2003   right  . EYE SURGERY     bilateral; for vision correction  . [place]  . TRIGGER FINGER  RELEASE Bilateral 2002   bilateral  . TRIGGER FINGER RELEASE Left 09/12/2017   Procedure: LEFT LONG FINGER TRIGGER RELEASE;  Surgeon: Carole Civil, MD;  Location: [facility];  Service: Orthopedics;  Laterality: Left;    Family History Family History  Problem Relation Age of Onset  . Stomach cancer Neg Hx   . Rectal cancer Neg Hx   . Esophageal cancer Neg Hx   . Colon cancer Neg Hx      Social History  reports that he quit smoking about 70 years ago. His smoking use included cigarettes. He has a 5.00 pack-year smoking history. He has never used smokeless tobacco. He reports that he does not drink alcohol or use drugs.  Medications  Current Outpatient Medications:  .  cetirizine (ZYRTEC) 10 MG chewable tablet, Chew 10 mg by mouth daily., Disp: , Rfl:  .  Cholecalciferol (VITAMIN D) 2000 units CAPS, Take 2,000 Units by mouth daily., Disp: , Rfl:  .  diazepam (VALIUM) 5 MG tablet, Take 5 mg by mouth daily. , Disp: , Rfl:  .  docusate sodium (COLACE) 100 MG capsule, Take 100 mg by mouth daily., Disp: , Rfl:  .  doxycycline (VIBRAMYCIN) 100 MG capsule, Take 100 mg by mouth 2 (two) times daily., Disp: , Rfl: 3 .  FIBER PO, Take 1 tablet by mouth daily., Disp: , Rfl:  .  fluticasone (FLONASE) 50 MCG/ACT nasal spray, Place 1 spray into both nostrils daily as needed for allergies or rhinitis., Disp: , Rfl:  .  Melatonin 5 MG CAPS, Take 5 mg by mouth at bedtime. , Disp: , Rfl:  .  Multiple Vitamin (MULTIVITAMIN) tablet, Take 1 tablet by mouth daily., Disp: , Rfl:  .  naproxen (NAPROSYN) 500 MG tablet, Take 1 tablet (500 mg total) by mouth 2 (two) times daily with a meal., Disp: 60 tablet, Rfl: 2 .  neomycin-bacitracin-polymyxin (NEOSPORIN) ointment, Apply 1 application topically as needed for wound care. apply to eye, Disp: , Rfl:  .  Omega-3 Fatty Acids (FISH OIL PO), Take 1,040 mg by mouth daily., Disp: , Rfl:  .  tamsulosin (FLOMAX) 0.4 MG CAPS capsule, TAKE 1 CAPSULE BY MOUTH DAILY, Disp:  , Rfl:   Allergies Silodosin  Review of Systems Review of Systems - Oncology ROS as per HPI otherwise 12 point ROS is negative.   Physical Exam  Vitals Wt Readings from Last 3 Encounters:  02/12/18 153 lb 4.8 oz (69.5 kg)  01/30/18 151 lb (68.5 kg)  10/25/17 146 lb (66.2 kg)   Temp Readings from Last 3 Encounters:  02/12/18 97.6 F (36.4 C) (Oral)  09/12/17 97.6 F (36.4 C) (Oral)  09/09/17 98 F (36.7 C) (Oral)   BP Readings from Last 3 Encounters:  02/12/18 140/65  01/30/18 132/80  10/25/17 114/65   Pulse Readings from Last 3 Encounters:  02/12/18 Marland Kitchen)  59  01/30/18 62  10/25/17 60   Constitutional: Well-developed, well-nourished, and in no distress.   HENT: Head: Normocephalic and atraumatic.  Mouth/Throat: No oropharyngeal exudate. Mucosa moist. Eyes: Pupils are equal, round, and reactive to light. Conjunctivae are normal. No scleral icterus.  Neck: Normal range of motion. Neck supple. No JVD present.  Cardiovascular: Normal rate, regular rhythm and normal heart sounds.  Exam reveals no gallop and no friction rub.   No murmur heard. Pulmonary/Chest: Effort normal and breath sounds normal. No respiratory distress. No wheezes.No rales.  Abdominal: Soft. Bowel sounds are normal. No distension. There is no tenderness. There is no guarding.  Musculoskeletal: No edema or tenderness.  Lymphadenopathy: No cervical, axillary or supraclavicular adenopathy.  Neurological: Alert and oriented to person, place, and time. No cranial nerve deficit.  Skin: Skin is warm and dry. No rash noted. No erythema. No pallor.  Psychiatric: Affect and judgment normal.   Labs No visits with results within 3 Day(s) from this visit.  Latest known visit with results is:  Appointment on 02/05/2018  Component Date Value Ref Range Status  . WBC 02/05/2018 5.8  4.0 - 10.5 K/uL Final  . RBC 02/05/2018 5.07  4.22 - 5.81 MIL/uL Final  . Hemoglobin 02/05/2018 16.5  13.0 - 17.0 g/dL Final  . HCT  02/05/2018 50.7  39.0 - 52.0 % Final  . MCV 02/05/2018 100.0  78.0 - 100.0 fL Final  . MCH 02/05/2018 32.5  26.0 - 34.0 pg Final  . MCHC 02/05/2018 32.5  30.0 - 36.0 g/dL Final  . RDW 02/05/2018 14.0  11.5 - 15.5 % Final  . Platelets 02/05/2018 127* 150 - 400 K/uL Final  . Neutrophils Relative % 02/05/2018 64  % Final  . Neutro Abs 02/05/2018 3.7  1.7 - 7.7 K/uL Final  . Lymphocytes Relative 02/05/2018 26  % Final  . Lymphs Abs 02/05/2018 1.5  0.7 - 4.0 K/uL Final  . Monocytes Relative 02/05/2018 9  % Final  . Monocytes Absolute 02/05/2018 0.5  0.1 - 1.0 K/uL Final  . Eosinophils Relative 02/05/2018 1  % Final  . Eosinophils Absolute 02/05/2018 0.1  0.0 - 0.7 K/uL Final  . Basophils Relative 02/05/2018 0  % Final  . Basophils Absolute 02/05/2018 0.0  0.0 - 0.1 K/uL Final   Performed at [facility], [address] [place], [place] [address]  . Sodium 02/05/2018 141  135 - 145 mmol/L Final  . Potassium 02/05/2018 5.3* 3.5 - 5.1 mmol/L Final  . Chloride 02/05/2018 105  101 - 111 mmol/L Final  . CO2 02/05/2018 26  22 - 32 mmol/L Final  . Glucose, Bld 02/05/2018 88  65 - 99 mg/dL Final  . BUN 02/05/2018 21* 6 - 20 mg/dL Final  . Creatinine, Ser 02/05/2018 1.37* 0.61 - 1.24 mg/dL Final  . Calcium 02/05/2018 10.0  8.9 - 10.3 mg/dL Final  . Total Protein 02/05/2018 7.2  6.5 - 8.1 g/dL Final  . Albumin 02/05/2018 4.1  3.5 - 5.0 g/dL Final  . AST 02/05/2018 31  15 - 41 U/L Final  . ALT 02/05/2018 29  17 - 63 U/L Final  . Alkaline Phosphatase 02/05/2018 63  38 - 126 U/L Final  . Total Bilirubin 02/05/2018 1.2  0.3 - 1.2 mg/dL Final  . GFR calc non Af Amer 02/05/2018 46* >60 mL/min Final  . GFR calc Af Amer 02/05/2018 54* >60 mL/min Final   Comment: (NOTE) The eGFR has been calculated using the CKD EPI equation.  This calculation has not been validated in all clinical situations. eGFR's persistently <60 mL/min signify possible Chronic Kidney Disease.   Georgiann Hahn gap 02/05/2018 10  5 - 15  Final   Performed at [facility], [address], [place], [place] [address]  . LDH 02/05/2018 158  98 - 192 U/L Final   Performed at [facility], [address], [place], [place] [address]  . TSH 02/05/2018 3.239  0.350 - 4.500 uIU/mL Final   Comment: Performed by a 3rd Generation assay with a functional sensitivity of <=0.01 uIU/mL. Performed at [facility], [address], [place], [place] [address]      Pathology Orders Placed This Encounter  Procedures  . CBC with Differential/Platelet    Standing Status:   Future    Standing Expiration Date:   10/15/2019  . Comprehensive metabolic panel    Standing Status:   Future    Standing Expiration Date:   10/15/2019  . Lactate dehydrogenase    Standing Status:   Future    Standing Expiration Date:   10/15/2019  . Beta 2 microglobulin, serum    Standing Status:   Future    Standing Expiration Date:   10/15/2019       Zoila Shutter MD

## 2018-02-21 DIAGNOSIS — E875 Hyperkalemia: Secondary | ICD-10-CM | POA: Diagnosis not present

## 2018-02-21 DIAGNOSIS — N401 Enlarged prostate with lower urinary tract symptoms: Secondary | ICD-10-CM | POA: Diagnosis not present

## 2018-02-21 DIAGNOSIS — N183 Chronic kidney disease, stage 3 (moderate): Secondary | ICD-10-CM | POA: Diagnosis not present

## 2018-02-21 DIAGNOSIS — C8580 Other specified types of non-Hodgkin lymphoma, unspecified site: Secondary | ICD-10-CM | POA: Diagnosis not present

## 2018-03-11 ENCOUNTER — Encounter (INDEPENDENT_AMBULATORY_CARE_PROVIDER_SITE_OTHER): Payer: Self-pay | Admitting: *Deleted

## 2018-03-11 ENCOUNTER — Encounter (INDEPENDENT_AMBULATORY_CARE_PROVIDER_SITE_OTHER): Payer: Self-pay | Admitting: Internal Medicine

## 2018-03-11 ENCOUNTER — Ambulatory Visit (INDEPENDENT_AMBULATORY_CARE_PROVIDER_SITE_OTHER): Payer: Medicare Other | Admitting: Internal Medicine

## 2018-03-11 VITALS — BP 110/80 | HR 60 | Temp 97.7°F | Ht 66.0 in | Wt 148.6 lb

## 2018-03-11 DIAGNOSIS — R1310 Dysphagia, unspecified: Secondary | ICD-10-CM

## 2018-03-11 DIAGNOSIS — R1319 Other dysphagia: Secondary | ICD-10-CM | POA: Insufficient documentation

## 2018-03-11 NOTE — Progress Notes (Signed)
   Subjective:    Patient ID: Arthur Walls, male    DOB: Jul 31, 1934, 84 y.o.   MRN: [id number]  HPI Referred by Dr. Luan Pulling for dysphagia.He tells me he is having trouble swallowing for a year.  He states he had EGD/ED years ago.  He says just about anything will lodge. If he eats a biscuit, it will lodge.  Sometimes pills will lodge. Appetite good. No weight loss. No abdominal pain. Has a BM x 1-3 times a day.No melena or BRRB.   Hx of non-Hodgkin's lymphoma. Has been in remission x 9 yrs.   Review of Systems Past Medical History:  Diagnosis Date  . Arthritis   . BPH (benign prostatic hyperplasia)   . HOH (hard of hearing)   . Hyperlipidemia   . Non Hodgkin's lymphoma ([place]) 2012  . Non-Hodgkin lymphoma ([place]) 08/14/2016  . Restless leg syndrome     Past Surgical History:  Procedure Laterality Date  . ELBOW SURGERY  2003   right  . EYE SURGERY     bilateral; for vision correction  . [place]  . TRIGGER FINGER RELEASE Bilateral 2002   bilateral  . TRIGGER FINGER RELEASE Left 09/12/2017   Procedure: LEFT LONG FINGER TRIGGER RELEASE;  Surgeon: Carole Civil, MD;  Location: [facility];  Service: Orthopedics;  Laterality: Left;    Allergies  Allergen Reactions  . Silodosin Other (See Comments)    Eyes watery and crust all around the eyes    Current Outpatient Medications on File Prior to Visit  Medication Sig Dispense Refill  . cetirizine (ZYRTEC) 10 MG chewable tablet Chew 10 mg by mouth daily.    . diazepam (VALIUM) 5 MG tablet Take 5 mg by mouth daily.     . Multiple Vitamins-Minerals (CENTRUM SILVER 50+MEN PO) Take by mouth.    . Omega-3 Fatty Acids (FISH OIL PO) Take 1,040 mg by mouth daily.    . tamsulosin (FLOMAX) 0.4 MG CAPS capsule TAKE 1 CAPSULE BY MOUTH DAILY    . naproxen (NAPROSYN) 500 MG tablet Take 1 tablet (500 mg total) by mouth 2 (two) times daily with a meal. 60 tablet 2   No current facility-administered medications on file prior to visit.          Objective:   Physical Exam Blood pressure 110/80, pulse 60, temperature 97.7 F (36.5 C), height 5\' 6"  (1.676 m), weight 148 lb 9.6 oz (67.4 kg). Alert and oriented. Skin warm and dry. Oral mucosa is moist.   . Sclera anicteric, conjunctivae is pink. Thyroid not enlarged. No cervical lymphadenopathy. Lungs clear. Heart regular rate and rhythm.  Abdomen is soft. Bowel sounds are positive. No hepatomegaly. No abdominal masses felt. No tenderness.  No edema to lower extremities.          Assessment & Plan:  Dysphagia. Stricture web needs to be ruled out.  EGD/ED. The risks of bleeding, perforation and infection were reviewed with patient.

## 2018-03-11 NOTE — Patient Instructions (Signed)
EGD/ED. The risks of bleeding, perforation and infection were reviewed with patient.  

## 2018-04-08 DIAGNOSIS — M546 Pain in thoracic spine: Secondary | ICD-10-CM | POA: Diagnosis not present

## 2018-04-08 DIAGNOSIS — M9903 Segmental and somatic dysfunction of lumbar region: Secondary | ICD-10-CM | POA: Diagnosis not present

## 2018-04-08 DIAGNOSIS — M545 Low back pain: Secondary | ICD-10-CM | POA: Diagnosis not present

## 2018-04-08 DIAGNOSIS — M9902 Segmental and somatic dysfunction of thoracic region: Secondary | ICD-10-CM | POA: Diagnosis not present

## 2018-04-08 DIAGNOSIS — M9905 Segmental and somatic dysfunction of pelvic region: Secondary | ICD-10-CM | POA: Diagnosis not present

## 2018-04-11 DIAGNOSIS — M9903 Segmental and somatic dysfunction of lumbar region: Secondary | ICD-10-CM | POA: Diagnosis not present

## 2018-04-11 DIAGNOSIS — M9902 Segmental and somatic dysfunction of thoracic region: Secondary | ICD-10-CM | POA: Diagnosis not present

## 2018-04-11 DIAGNOSIS — M9905 Segmental and somatic dysfunction of pelvic region: Secondary | ICD-10-CM | POA: Diagnosis not present

## 2018-04-11 DIAGNOSIS — M546 Pain in thoracic spine: Secondary | ICD-10-CM | POA: Diagnosis not present

## 2018-04-11 DIAGNOSIS — M545 Low back pain: Secondary | ICD-10-CM | POA: Diagnosis not present

## 2018-04-18 DIAGNOSIS — M9903 Segmental and somatic dysfunction of lumbar region: Secondary | ICD-10-CM | POA: Diagnosis not present

## 2018-04-18 DIAGNOSIS — M545 Low back pain: Secondary | ICD-10-CM | POA: Diagnosis not present

## 2018-04-18 DIAGNOSIS — M546 Pain in thoracic spine: Secondary | ICD-10-CM | POA: Diagnosis not present

## 2018-04-18 DIAGNOSIS — M9905 Segmental and somatic dysfunction of pelvic region: Secondary | ICD-10-CM | POA: Diagnosis not present

## 2018-04-18 DIAGNOSIS — M9902 Segmental and somatic dysfunction of thoracic region: Secondary | ICD-10-CM | POA: Diagnosis not present

## 2018-04-21 DIAGNOSIS — R972 Elevated prostate specific antigen [PSA]: Secondary | ICD-10-CM | POA: Diagnosis not present

## 2018-04-21 DIAGNOSIS — N401 Enlarged prostate with lower urinary tract symptoms: Secondary | ICD-10-CM | POA: Diagnosis not present

## 2018-04-21 DIAGNOSIS — N138 Other obstructive and reflux uropathy: Secondary | ICD-10-CM | POA: Diagnosis not present

## 2018-04-30 ENCOUNTER — Ambulatory Visit (HOSPITAL_COMMUNITY)
Admission: RE | Admit: 2018-04-30 | Discharge: 2018-04-30 | Disposition: A | Discharge status: Home / Self Care | Payer: Medicare Other | Source: Ambulatory Visit | Attending: Internal Medicine | Admitting: Internal Medicine

## 2018-04-30 ENCOUNTER — Encounter (HOSPITAL_COMMUNITY): Payer: Self-pay | Admitting: *Deleted

## 2018-04-30 ENCOUNTER — Other Ambulatory Visit: Payer: Self-pay

## 2018-04-30 ENCOUNTER — Encounter (HOSPITAL_COMMUNITY)
Admission: RE | Disposition: A | Discharge status: Home / Self Care | Payer: Self-pay | Source: Ambulatory Visit | Attending: Internal Medicine

## 2018-04-30 DIAGNOSIS — Z8572 Personal history of non-Hodgkin lymphomas: Secondary | ICD-10-CM | POA: Diagnosis not present

## 2018-04-30 DIAGNOSIS — K317 Polyp of stomach and duodenum: Secondary | ICD-10-CM | POA: Insufficient documentation

## 2018-04-30 DIAGNOSIS — N400 Enlarged prostate without lower urinary tract symptoms: Secondary | ICD-10-CM | POA: Diagnosis not present

## 2018-04-30 DIAGNOSIS — Z87891 Personal history of nicotine dependence: Secondary | ICD-10-CM | POA: Diagnosis not present

## 2018-04-30 DIAGNOSIS — K2980 Duodenitis without bleeding: Secondary | ICD-10-CM | POA: Diagnosis not present

## 2018-04-30 DIAGNOSIS — Z79899 Other long term (current) drug therapy: Secondary | ICD-10-CM | POA: Diagnosis not present

## 2018-04-30 DIAGNOSIS — K2970 Gastritis, unspecified, without bleeding: Secondary | ICD-10-CM | POA: Insufficient documentation

## 2018-04-30 DIAGNOSIS — R1314 Dysphagia, pharyngoesophageal phase: Secondary | ICD-10-CM | POA: Diagnosis not present

## 2018-04-30 DIAGNOSIS — K3189 Other diseases of stomach and duodenum: Secondary | ICD-10-CM | POA: Diagnosis not present

## 2018-04-30 DIAGNOSIS — R1310 Dysphagia, unspecified: Secondary | ICD-10-CM | POA: Insufficient documentation

## 2018-04-30 DIAGNOSIS — R1319 Other dysphagia: Secondary | ICD-10-CM | POA: Insufficient documentation

## 2018-04-30 HISTORY — PX: BIOPSY: SHX5522

## 2018-04-30 HISTORY — PX: ESOPHAGEAL DILATION: SHX303

## 2018-04-30 HISTORY — PX: ESOPHAGOGASTRODUODENOSCOPY: SHX5428

## 2018-04-30 SURGERY — EGD (ESOPHAGOGASTRODUODENOSCOPY)
Anesthesia: Moderate Sedation

## 2018-04-30 MED ORDER — MIDAZOLAM HCL 5 MG/5ML IJ SOLN
INTRAMUSCULAR | Status: DC | PRN
  Administered 2018-04-30 (×2): 1 mg via INTRAVENOUS
  Administered 2018-04-30: 2 mg via INTRAVENOUS

## 2018-04-30 MED ORDER — MEPERIDINE HCL 50 MG/ML IJ SOLN
INTRAMUSCULAR | Status: DC | PRN
  Administered 2018-04-30 (×2): 25 mg via INTRAVENOUS

## 2018-04-30 MED ORDER — SODIUM CHLORIDE 0.9 % IV SOLN
INTRAVENOUS | Status: DC
  Administered 2018-04-30: 1000 mL via INTRAVENOUS

## 2018-04-30 MED ORDER — LIDOCAINE VISCOUS HCL 2 % MT SOLN
OROMUCOSAL | Status: DC | PRN
  Administered 2018-04-30: 4 mL via OROMUCOSAL

## 2018-04-30 MED ORDER — MIDAZOLAM HCL 5 MG/5ML IJ SOLN
INTRAMUSCULAR | Status: AC
  Filled 2018-04-30: qty 10

## 2018-04-30 MED ORDER — MEPERIDINE HCL 50 MG/ML IJ SOLN
INTRAMUSCULAR | Status: AC
  Filled 2018-04-30: qty 1

## 2018-04-30 MED ORDER — STERILE WATER FOR IRRIGATION IR SOLN
Status: DC | PRN
  Administered 2018-04-30: 14:00:00

## 2018-04-30 MED ORDER — LIDOCAINE VISCOUS HCL 2 % MT SOLN
OROMUCOSAL | Status: AC
  Filled 2018-04-30: qty 15

## 2018-04-30 NOTE — H&P (Signed)
Arthur Walls is an 84 y.o. male.   Chief Complaint: Patient is here for EGD and ED. HPI: Patient is 84 year old Caucasian male who presents with several month history of intermittent solid food dysphagia.  He points to the suprasternal area as site of bolus obstruction.  He has no difficulty with quits.  He denies heartburn nausea vomiting melena or rectal bleeding.  His appetite is good and his weight is stable if anything he has gained few pounds this year. Last esophageal dilation was several years ago. He does not take aspirin or other anticoagulants.  Past Medical History:  Diagnosis Date  . Arthritis   . BPH (benign prostatic hyperplasia)   . HOH (hard of hearing)   . Hyperlipidemia   . Non Hodgkin's lymphoma ([place]) 2012  . Non-Hodgkin lymphoma ([place]) 08/14/2016  . Restless leg syndrome     Past Surgical History:  Procedure Laterality Date  . ELBOW SURGERY  2003   right  . EYE SURGERY     bilateral; for vision correction  . [place]  . TRIGGER FINGER RELEASE Bilateral 2002   bilateral  . TRIGGER FINGER RELEASE Left 09/12/2017   Procedure: LEFT LONG FINGER TRIGGER RELEASE;  Surgeon: Carole Civil, MD;  Location: [facility];  Service: Orthopedics;  Laterality: Left;    Family History  Problem Relation Age of Onset  . Stomach cancer Neg Hx   . Rectal cancer Neg Hx   . Esophageal cancer Neg Hx   . Colon cancer Neg Hx    Social History:  reports that he quit smoking about 70 years ago. His smoking use included cigarettes. He has a 5.00 pack-year smoking history. He has never used smokeless tobacco. He reports that he does not drink alcohol or use drugs.  Allergies:  Allergies  Allergen Reactions  . Naproxen Sodium Other (See Comments)    Weak legs and extreme fatigue, all he could do was sit in a chair  . Silodosin Other (See Comments)    Eyes watery and crust all around the eyes    Medications Prior to Admission  Medication Sig Dispense Refill  .  Artificial Tear Ointment (DRY EYES OP) Place 1 drop into both eyes every morning.    . Calcium Polycarbophil (FIBER-CAPS PO) Take 1 capsule by mouth daily.    . cetirizine (ZYRTEC) 10 MG tablet Take 10 mg by mouth daily.     . Cholecalciferol (VITAMIN D) 2000 units CAPS Take 2,000 Units by mouth daily.    . diazepam (VALIUM) 5 MG tablet Take 5 mg by mouth daily.     Marland Kitchen docusate sodium (COLACE) 100 MG capsule Take 100 mg by mouth daily.    . Melatonin 5 MG TABS Take 5 mg by mouth daily.    . Multiple Vitamins-Minerals (CENTRUM SILVER 50+MEN PO) Take by mouth.    . Omega-3 Fatty Acids (FISH OIL PO) Take 1 capsule by mouth daily.     . tamsulosin (FLOMAX) 0.4 MG CAPS capsule TAKE 1 CAPSULE BY MOUTH DAILY    . naproxen (NAPROSYN) 500 MG tablet Take 1 tablet (500 mg total) by mouth 2 (two) times daily with a meal. (Patient not taking: Reported on 04/28/2018) 60 tablet 2    No results found for this or any previous visit (from the past 48 hour(s)). No results found.  ROS  Blood pressure (!) 153/77, pulse (!) 54, temperature 97.7 F (36.5 C), temperature source Oral, resp. rate (!) 24, height 5\' 6"  (  1.676 m), weight 148 lb (67.1 kg), SpO2 99 %. Physical Exam  Constitutional: He appears well-developed.  HENT:  Mouth/Throat: Oropharynx is clear and moist.  Eyes: Conjunctivae are normal. No scleral icterus.  Neck: No thyromegaly present.  Cardiovascular: Normal rate, regular rhythm and normal heart sounds.  No murmur heard. Respiratory: Effort normal and breath sounds normal.  GI: Soft. He exhibits no distension and no mass.  Musculoskeletal: He exhibits no edema.  Lymphadenopathy:    He has no cervical adenopathy.  Neurological: He is alert.  Skin: Skin is warm and dry.     Assessment/Plan Solid food dysphagia. EGD with ED.  Hildred Laser, MD 04/30/2018, 2:12 PM

## 2018-04-30 NOTE — Discharge Instructions (Signed)
Upper Endoscopy, Care After Refer to this sheet in the next few weeks. These instructions provide you with information about caring for yourself after your procedure. Your health care provider may also give you more specific instructions. Your treatment has been planned according to current medical practices, but problems sometimes occur. Call your health care provider if you have any problems or questions after your procedure. What can I expect after the procedure? After the procedure, it is common to have:  A sore throat.  Bloating.  Nausea.  Follow these instructions at home:  Follow instructions from your health care provider about what to eat or drink after your procedure.  Return to your normal activities as told by your health care provider. Ask your health care provider what activities are safe for you.  Take over-the-counter and prescription medicines only as told by your health care provider.  Do not drive for 24 hours if you received a sedative.  Keep all follow-up visits as told by your health care provider. This is important. Contact a health care provider if:  You have a sore throat that lasts longer than one day.  You have trouble swallowing. Get help right away if:  You have a fever.  You vomit blood or your vomit looks like coffee grounds.  You have bloody, black, or tarry stools.  You have a severe sore throat or you cannot swallow.  You have difficulty breathing.  You have severe pain in your chest or belly. This information is not intended to replace advice given to you by your health care provider. Make sure you discuss any questions you have with your health care provider. Document Released: 04/29/2012 Document Revised: 04/05/2016 Document Reviewed: 08/11/2015 Elsevier Interactive Patient Education  2018 [employer].   No aspirin or NSAIDs for 24 hours. Resume other medications and diet as before. No driving for 24 hours. Physician will call  with biopsy results.   Esophageal Dilatation Esophageal dilatation is a procedure to open a blocked or narrowed part of the esophagus. The esophagus is the long tube in your throat that carries food and liquid from your mouth to your stomach. The procedure is also called esophageal dilation. You may need this procedure if you have a buildup of scar tissue in your esophagus that makes it difficult, painful, or even impossible to swallow. This can be caused by gastroesophageal reflux disease (GERD). In rare cases, people need this procedure because they have cancer of the esophagus or a problem with the way food moves through the esophagus. Sometimes you may need to have another dilatation to enlarge the opening of the esophagus gradually. Tell a health care provider about:  Any allergies you have.  All medicines you are taking, including vitamins, herbs, eye drops, creams, and over-the-counter medicines.  Any problems you or family members have had with anesthetic medicines.  Any blood disorders you have.  Any surgeries you have had.  Any medical conditions you have.  Any antibiotic medicines you are required to take before dental procedures. What are the risks? Generally, this is a safe procedure. However, problems can occur and include:  Bleeding from a tear in the lining of the esophagus.  A hole (perforation) in the esophagus.  What happens before the procedure?  Do not eat or drink anything after midnight on the night before the procedure or as directed by your health care provider.  Ask your health care provider about changing or stopping your regular medicines. This is especially important if  you are taking diabetes medicines or blood thinners.  Plan to have someone take you home after the procedure. What happens during the procedure?  You will be given a medicine that makes you relaxed and sleepy (sedative).  A medicine may be sprayed or gargled to numb the back of the  throat.  Your health care provider can use various instruments to do an esophageal dilatation. During the procedure, the instrument used will be placed in your mouth and passed down into your esophagus. Options include: ? Simple dilators. This instrument is carefully placed in the esophagus to stretch it. ? Guided wire bougies. In this method, a flexible tube (endoscope) is used to insert a wire into the esophagus. The dilator is passed over this wire to enlarge the esophagus. Then the wire is removed. ? Balloon dilators. An endoscope with a small balloon at the end is passed down into the esophagus. Inflating the balloon gently stretches the esophagus and opens it up. What happens after the procedure?  Your blood pressure, heart rate, breathing rate, and blood oxygen level will be monitored often until the medicines you were given have worn off.  Your throat may feel slightly sore and will probably still feel numb. This will improve slowly over time.  You will not be allowed to eat or drink until the throat numbness has resolved.  If this is a same-day procedure, you may be allowed to go home once you have been able to drink, urinate, and sit on the edge of the bed without nausea or dizziness.  If this is a same-day procedure, you should have a friend or family member with you for the next 24 hours after the procedure. This information is not intended to replace advice given to you by your health care provider. Make sure you discuss any questions you have with your health care provider. Document Released: 12/20/2005 Document Revised: 04/05/2016 Document Reviewed: 03/10/2014 Elsevier Interactive Patient Education  2018 [place] POST-ANESTHESIA  IMMEDIATELY FOLLOWING SURGERY:  Do not drive or operate machinery for the first twenty four hours after surgery.  Do not make any important decisions for twenty four hours after surgery or while taking narcotic pain  medications or sedatives.  If you develop intractable nausea and vomiting or a severe headache please notify your doctor immediately.  FOLLOW-UP:  Please make an appointment with your surgeon as instructed. You do not need to follow up with anesthesia unless specifically instructed to do so.  WOUND CARE INSTRUCTIONS (if applicable):  Keep a dry clean dressing on the anesthesia/puncture wound site if there is drainage.  Once the wound has quit draining you may leave it open to air.  Generally you should leave the bandage intact for twenty four hours unless there is drainage.  If the epidural site drains for more than 36-48 hours please call the anesthesia department.  QUESTIONS?:  Please feel free to call your physician or the hospital operator if you have any questions, and they will be happy to assist you.

## 2018-04-30 NOTE — Op Note (Signed)
[facility] Patient Name: Arthur Walls Procedure Date: 04/30/2018 1:57 PM MRN: [id number] Date of Birth: 1934-10-05 Attending MD: Hildred Laser , MD CSN: [id number] Age: 84 Admit Type: Outpatient Procedure:                Upper GI endoscopy Indications:              Esophageal dysphagia Providers:                Hildred Laser, MD, Otis Peak B. Sharon Seller, RN, Nelma Rothman, Technician Referring MD:             Jasper Loser. Luan Pulling, MD Medicines:                Lidocaine spray, Meperidine 50 mg IV, Midazolam 4                            mg IV Complications:            No immediate complications. Estimated Blood Loss:     Estimated blood loss was minimal. Procedure:                Pre-Anesthesia Assessment:                           - Prior to the procedure, a History and Physical                            was performed, and patient medications and                            allergies were reviewed. The patient's tolerance of                            previous anesthesia was also reviewed. The risks                            and benefits of the procedure and the sedation                            options and risks were discussed with the patient.                            All questions were answered, and informed consent                            was obtained. Prior Anticoagulants: The patient has                            taken no previous anticoagulant or antiplatelet                            agents. ASA Grade Assessment: II - A patient with  mild systemic disease. After reviewing the risks                            and benefits, the patient was deemed in                            satisfactory condition to undergo the procedure.                           After obtaining informed consent, the endoscope was                            passed under direct vision. Throughout the                            procedure, the patient's blood  pressure, pulse, and                            oxygen saturations were monitored continuously. The                            EG-[id number] ([id number]) scope was introduced through the                            mouth, and advanced to the second part of duodenum.                            The upper GI endoscopy was accomplished without                            difficulty. The patient tolerated the procedure                            well. Scope In: 2:22:42 PM Scope Out: 2:38:16 PM Total Procedure Duration: 0 hours 15 minutes 34 seconds  Findings:      The examined esophagus was normal.      The Z-line was regular and was found 39 cm from the incisors.      No endoscopic abnormality was evident in the esophagus to explain the       patient's complaint of dysphagia. It was decided, however, to proceed       with dilation of the entire esophagus. The scope was withdrawn. Dilation       was performed with a Maloney dilator with mild resistance at 54 Fr. The       dilation site was examined following endoscope reinsertion and showed no       change and no bleeding, mucosal tear or perforation.      Diffuse moderate mucosal changes characterized by congestion and       erythema were found in the gastric fundus and in the gastric body.       Biopsies were taken with a cold forceps for histology. The pathology       specimen was placed into Bottle Number 2.      Patchy mild inflammation characterized by congestion (edema), erosions       and erythema was  found in the gastric antrum. Biopsies were taken with a       cold forceps for histology. The pathology specimen was placed into       Bottle Number 1.      The cardia and pylorus were normal.      Patchy mild inflammation characterized by congestion (edema), erosions       and erythema was found in the duodenal bulb.      The second portion of the duodenum was normal. Impression:               - Normal esophagus.                           -  Z-line regular, 39 cm from the incisors.                           - No endoscopic esophageal abnormality to explain                            patient's dysphagia. Esophagus dilated.                           - Congested and erythematousexpanded gastric folds                            in the gastric fundus and gastric body. Biopsied.                           - Gastritis. Biopsied.                           - Normal cardia and pylorus.                           - Duodenitis.                           - Normal second portion of the duodenum. Moderate Sedation:      Moderate (conscious) sedation was administered by the endoscopy nurse       and supervised by the endoscopist. The following parameters were       monitored: oxygen saturation, heart rate, blood pressure, CO2       capnography and response to care. Total physician intraservice time was       22 minutes. Recommendation:           - Patient has a contact number available for                            emergencies. The signs and symptoms of potential                            delayed complications were discussed with the                            patient. Return to normal activities tomorrow.  Written discharge instructions were provided to the                            patient.                           - Resume previous diet today.                           - Continue present medications.                           - No aspirin, ibuprofen, naproxen, or other                            non-steroidal anti-inflammatory drugs for 1 day.                           - Await pathology results.                           - Repeat upper endoscopy PRN. Procedure Code(s):        --- Professional ---                           [phone], Esophagogastroduodenoscopy, flexible,                            transoral; with biopsy, single or multiple                           43450, Dilation of esophagus, by unguided sound  or                            bougie, single or multiple passes                           G0500, Moderate sedation services provided by the                            same physician or other qualified health care                            professional performing a gastrointestinal                            endoscopic service that sedation supports,                            requiring the presence of an independent trained                            observer to assist in the monitoring of the                            patient's level of consciousness and physiological  status; initial 15 minutes of intra-service time;                            patient age 64 years or older (additional time may                            be reported with [phone], as appropriate) Diagnosis Code(s):        --- Professional ---                           K31.89, Other diseases of stomach and duodenum                           K29.70, Gastritis, unspecified, without bleeding                           K29.80, Duodenitis without bleeding                           R13.14, Dysphagia, pharyngoesophageal phase CPT copyright 2017 American Medical Association. All rights reserved. The codes documented in this report are preliminary and upon coder review may  be revised to meet current compliance requirements. Hildred Laser, MD Hildred Laser, MD 04/30/2018 3:01:34 PM This report has been signed electronically. Number of Addenda: 0

## 2018-05-05 ENCOUNTER — Encounter (HOSPITAL_COMMUNITY): Payer: Self-pay | Admitting: Internal Medicine

## 2018-05-05 DIAGNOSIS — M1812 Unilateral primary osteoarthritis of first carpometacarpal joint, left hand: Secondary | ICD-10-CM | POA: Diagnosis not present

## 2018-05-05 DIAGNOSIS — M19042 Primary osteoarthritis, left hand: Secondary | ICD-10-CM | POA: Diagnosis not present

## 2018-05-21 DIAGNOSIS — B029 Zoster without complications: Secondary | ICD-10-CM | POA: Diagnosis not present

## 2018-05-21 DIAGNOSIS — I10 Essential (primary) hypertension: Secondary | ICD-10-CM | POA: Diagnosis not present

## 2018-05-21 DIAGNOSIS — N401 Enlarged prostate with lower urinary tract symptoms: Secondary | ICD-10-CM | POA: Diagnosis not present

## 2018-05-21 DIAGNOSIS — L2081 Atopic neurodermatitis: Secondary | ICD-10-CM | POA: Diagnosis not present

## 2018-05-21 DIAGNOSIS — J209 Acute bronchitis, unspecified: Secondary | ICD-10-CM | POA: Diagnosis not present

## 2018-05-21 DIAGNOSIS — G2581 Restless legs syndrome: Secondary | ICD-10-CM | POA: Diagnosis not present

## 2018-05-21 DIAGNOSIS — E875 Hyperkalemia: Secondary | ICD-10-CM | POA: Diagnosis not present

## 2018-05-21 DIAGNOSIS — Z0000 Encounter for general adult medical examination without abnormal findings: Secondary | ICD-10-CM | POA: Diagnosis not present

## 2018-05-21 DIAGNOSIS — C8580 Other specified types of non-Hodgkin lymphoma, unspecified site: Secondary | ICD-10-CM | POA: Diagnosis not present

## 2018-05-21 DIAGNOSIS — N183 Chronic kidney disease, stage 3 (moderate): Secondary | ICD-10-CM | POA: Diagnosis not present

## 2018-06-16 DIAGNOSIS — M19042 Primary osteoarthritis, left hand: Secondary | ICD-10-CM | POA: Diagnosis not present

## 2018-06-16 DIAGNOSIS — M1812 Unilateral primary osteoarthritis of first carpometacarpal joint, left hand: Secondary | ICD-10-CM | POA: Diagnosis not present

## 2018-08-20 DIAGNOSIS — E785 Hyperlipidemia, unspecified: Secondary | ICD-10-CM | POA: Diagnosis not present

## 2018-08-20 DIAGNOSIS — N183 Chronic kidney disease, stage 3 (moderate): Secondary | ICD-10-CM | POA: Diagnosis not present

## 2018-08-20 DIAGNOSIS — I10 Essential (primary) hypertension: Secondary | ICD-10-CM | POA: Diagnosis not present

## 2018-08-20 DIAGNOSIS — Z23 Encounter for immunization: Secondary | ICD-10-CM | POA: Diagnosis not present

## 2018-08-20 DIAGNOSIS — N401 Enlarged prostate with lower urinary tract symptoms: Secondary | ICD-10-CM | POA: Diagnosis not present

## 2018-10-27 DIAGNOSIS — N138 Other obstructive and reflux uropathy: Secondary | ICD-10-CM | POA: Diagnosis not present

## 2018-10-27 DIAGNOSIS — N401 Enlarged prostate with lower urinary tract symptoms: Secondary | ICD-10-CM | POA: Diagnosis not present

## 2018-10-27 DIAGNOSIS — R972 Elevated prostate specific antigen [PSA]: Secondary | ICD-10-CM | POA: Diagnosis not present

## 2019-02-06 ENCOUNTER — Other Ambulatory Visit (HOSPITAL_COMMUNITY): Payer: Medicare Other

## 2019-02-13 ENCOUNTER — Ambulatory Visit (HOSPITAL_COMMUNITY): Payer: Medicare Other | Admitting: Hematology

## 2019-03-09 ENCOUNTER — Other Ambulatory Visit (HOSPITAL_COMMUNITY): Payer: Self-pay

## 2019-03-09 DIAGNOSIS — C8217 Follicular lymphoma grade II, spleen: Secondary | ICD-10-CM

## 2019-03-09 DIAGNOSIS — C8580 Other specified types of non-Hodgkin lymphoma, unspecified site: Secondary | ICD-10-CM

## 2019-03-10 ENCOUNTER — Other Ambulatory Visit: Payer: Self-pay

## 2019-03-10 ENCOUNTER — Inpatient Hospital Stay (HOSPITAL_COMMUNITY): Payer: Medicare Other | Attending: Hematology

## 2019-03-10 DIAGNOSIS — C8580 Other specified types of non-Hodgkin lymphoma, unspecified site: Secondary | ICD-10-CM

## 2019-03-10 DIAGNOSIS — C8217 Follicular lymphoma grade II, spleen: Secondary | ICD-10-CM | POA: Insufficient documentation

## 2019-03-10 LAB — COMPREHENSIVE METABOLIC PANEL
ALT: 29 U/L (ref 0–44)
AST: 29 U/L (ref 15–41)
Albumin: 4.1 g/dL (ref 3.5–5.0)
Alkaline Phosphatase: 69 U/L (ref 38–126)
Anion gap: 8 (ref 5–15)
BUN: 21 mg/dL (ref 8–23)
CO2: 26 mmol/L (ref 22–32)
Calcium: 9.6 mg/dL (ref 8.9–10.3)
Chloride: 104 mmol/L (ref 98–111)
Creatinine, Ser: 1.42 mg/dL — ABNORMAL HIGH (ref 0.61–1.24)
GFR calc Af Amer: 53 mL/min — ABNORMAL LOW (ref >60)
GFR calc non Af Amer: 45 mL/min — ABNORMAL LOW (ref >60)
Glucose, Bld: 114 mg/dL — ABNORMAL HIGH (ref 70–99)
Potassium: 4.9 mmol/L (ref 3.5–5.1)
Sodium: 138 mmol/L (ref 135–145)
Total Bilirubin: 0.9 mg/dL (ref 0.3–1.2)
Total Protein: 7.1 g/dL (ref 6.5–8.1)

## 2019-03-10 LAB — CBC WITH DIFFERENTIAL/PLATELET
Abs Immature Granulocytes: 0.01 10*3/uL (ref 0.00–0.07)
Basophils Absolute: 0 10*3/uL (ref 0.0–0.1)
Basophils Relative: 0 %
Eosinophils Absolute: 0.1 10*3/uL (ref 0.0–0.5)
Eosinophils Relative: 1 %
HCT: 48.3 % (ref 39.0–52.0)
Hemoglobin: 16.2 g/dL (ref 13.0–17.0)
Immature Granulocytes: 0 %
Lymphocytes Relative: 29 %
Lymphs Abs: 1.5 10*3/uL (ref 0.7–4.0)
MCH: 33.5 pg (ref 26.0–34.0)
MCHC: 33.5 g/dL (ref 30.0–36.0)
MCV: 99.8 fL (ref 80.0–100.0)
Monocytes Absolute: 0.4 10*3/uL (ref 0.1–1.0)
Monocytes Relative: 8 %
Neutro Abs: 3.1 10*3/uL (ref 1.7–7.7)
Neutrophils Relative %: 62 %
Platelets: 126 10*3/uL — ABNORMAL LOW (ref 150–400)
RBC: 4.84 MIL/uL (ref 4.22–5.81)
RDW: 12.8 % (ref 11.5–15.5)
WBC: 5.1 10*3/uL (ref 4.0–10.5)
nRBC: 0 % (ref 0.0–0.2)

## 2019-03-10 LAB — LACTATE DEHYDROGENASE: LDH: 129 U/L (ref 98–192)

## 2019-03-11 LAB — BETA 2 MICROGLOBULIN, SERUM: Beta-2 Microglobulin: 2.6 mg/L — ABNORMAL HIGH (ref 0.6–2.4)

## 2019-03-17 ENCOUNTER — Inpatient Hospital Stay (HOSPITAL_COMMUNITY): Payer: Medicare Other | Attending: Hematology | Admitting: Nurse Practitioner

## 2019-03-17 ENCOUNTER — Other Ambulatory Visit: Payer: Self-pay

## 2019-03-17 DIAGNOSIS — E785 Hyperlipidemia, unspecified: Secondary | ICD-10-CM | POA: Insufficient documentation

## 2019-03-17 DIAGNOSIS — G2581 Restless legs syndrome: Secondary | ICD-10-CM | POA: Diagnosis not present

## 2019-03-17 DIAGNOSIS — D696 Thrombocytopenia, unspecified: Secondary | ICD-10-CM

## 2019-03-17 DIAGNOSIS — Z79899 Other long term (current) drug therapy: Secondary | ICD-10-CM | POA: Insufficient documentation

## 2019-03-17 DIAGNOSIS — Z87891 Personal history of nicotine dependence: Secondary | ICD-10-CM | POA: Diagnosis not present

## 2019-03-17 DIAGNOSIS — Z8572 Personal history of non-Hodgkin lymphomas: Secondary | ICD-10-CM | POA: Insufficient documentation

## 2019-03-17 DIAGNOSIS — M129 Arthropathy, unspecified: Secondary | ICD-10-CM | POA: Diagnosis not present

## 2019-03-17 DIAGNOSIS — C8217 Follicular lymphoma grade II, spleen: Secondary | ICD-10-CM

## 2019-03-17 DIAGNOSIS — N289 Disorder of kidney and ureter, unspecified: Secondary | ICD-10-CM | POA: Insufficient documentation

## 2019-03-17 DIAGNOSIS — N400 Enlarged prostate without lower urinary tract symptoms: Secondary | ICD-10-CM | POA: Diagnosis not present

## 2019-03-17 NOTE — Progress Notes (Signed)
Arthur Walls, [place] [address]   CLINIC:  Medical Oncology/Hematology  PCP:  Sinda Du, MD [place] [address] [phone]   REASON FOR VISIT: Follow-up for non-Hodgkin's lymphoma  CURRENT THERAPY: Observation  BRIEF ONCOLOGIC HISTORY:    Non-Hodgkin lymphoma ([place])   03/13/2010 Initial Diagnosis    Non-Hodgkin lymphoma ([place]), biopsy of paraspinous mass in the cervical area    03/13/2010 - 04/14/2010 Radiation Therapy    Radiation delivered to 6.5 cm mass at the T1 level, 30 Gy.    04/25/2010 - 06/11/2011 Chemotherapy    Rituxan 375 mg/m2 initiated weekly x 4 followed by 6 doses of maintenance Rituxan given every 3 months for 1.5 years.     12/12/2011 PET scan    No findings to suggest residual disease on today's examination. Specifically, no mass or hypermetabolic activity noted around the T1 and T2 neural foramina (where the patient's primary lesion was first discovered in February 2011).    08/14/2013 PET scan    No evidence of lymphomatous involvement.      CANCER STAGING: Cancer Staging Non-Hodgkin lymphoma [facility]) Staging form: Lymphoid Neoplasms, AJCC 6th Edition - Clinical stage from 03/13/2010: Stage IE (lymphoma only) - Signed by Baird Cancer, PA-C on 08/14/2016    INTERVAL HISTORY:  Arthur Walls 85 y.o. male returns for routine follow-up for non-Hodgkin's lymphoma.  He reports he is doing well since his last visit a year ago.  He reports he is having trouble swallowing and needs to follow-up with Dr. Dereck Leep to have his esophagus stretched again.  He reports no new lumps that he is felt.  He reports no new pain in his arms or back. Denies any nausea, vomiting, or diarrhea. Denies any new pains. Had not noticed any recent bleeding such as epistaxis, hematuria or hematochezia. Denies recent chest pain on exertion, shortness of breath on minimal exertion, pre-syncopal episodes, or palpitations. Denies any  numbness or tingling in hands or feet. Denies any recent fevers, infections, or recent hospitalizations. Patient reports appetite at 100% and energy level at 100%.  Eating well and maintaining his weight at this time.  He remains active and works outside most of the day.    REVIEW OF SYSTEMS:  Review of Systems  HENT:   Positive for trouble swallowing.   All other systems reviewed and are negative.    PAST MEDICAL/SURGICAL HISTORY:  Past Medical History:  Diagnosis Date  . Arthritis   . BPH (benign prostatic hyperplasia)   . HOH (hard of hearing)   . Hyperlipidemia   . Non Hodgkin's lymphoma ([place]) 2012  . Non-Hodgkin lymphoma ([place]) 08/14/2016  . Restless leg syndrome    Past Surgical History:  Procedure Laterality Date  . BIOPSY  04/30/2018   Procedure: BIOPSY;  Surgeon: Rogene Houston, MD;  Location[facility];  Service: Endoscopy;;  gastric   . ELBOW SURGERY  2003   right  . ESOPHAGEAL DILATION N/A 04/30/2018   Procedure: ESOPHAGEAL DILATION;  Surgeon: Rogene Houston, MD;  Location: [facility] ENDO [facility];  Service: Endoscopy;  Laterality: N/A;  . ESOPHAGOGASTRODUODENOSCOPY N/A 04/30/2018   Procedure: ESOPHAGOGASTRODUODENOSCOPY (EGD);  Surgeon: Rogene Houston, MD;  Location: [facility] END[facility];  Service: Endoscopy;  Laterality: N/A;  1:40  . EYE SURGERY     bilateral; for vision correction  . [place]  . TRIGGER FINGER RELEASE Bilateral 2002   bilateral  . TRIGGER FINGER RELEASE Left 09/12/2017  Procedure: LEFT LONG FINGER TRIGGER RELEASE;  Surgeon: Carole Civil, MD;  Location: [facility] ORS;  Service: Orthopedics;  Laterality: Left;     SOCIAL HISTORY:  Social History   Socioeconomic History  . Marital status: Legally Separated    Spouse name: Not on file  . Number of children: Not on file  . Years of education: Not on file  . Highest education level: Not on file  Occupational History  . Not on file  Social Needs  . Financial resource strain: Not on  file  . Food insecurity:    Worry: Not on file    Inability: Not on file  . Transportation needs:    Medical: Not on file    Non-medical: Not on file  Tobacco Use  . Smoking status: Former Smoker    Packs/day: 0.50    Years: 10.00    Pack years: 5.00    Types: Cigarettes    Last attempt to quit: 01/24/1948    Years since quitting: 71.1  . Smokeless tobacco: Never Used  Substance and Sexual Activity  . Alcohol use: No  . Drug use: No  . Sexual activity: Not Currently    Birth control/protection: None  Lifestyle  . Physical activity:    Days per week: Not on file    Minutes per session: Not on file  . Stress: Not on file  Relationships  . Social connections:    Talks on phone: Not on file    Gets together: Not on file    Attends religious service: Not on file    Active member of club or organization: Not on file    Attends meetings of clubs or organizations: Not on file    Relationship status: Not on file  . Intimate partner violence:    Fear of current or ex partner: Not on file    Emotionally abused: Not on file    Physically abused: Not on file    Forced sexual activity: Not on file  Other Topics Concern  . Not on file  Social History Narrative  . Not on file    FAMILY HISTORY:  Family History  Problem Relation Age of Onset  . Stomach cancer Neg Hx   . Rectal cancer Neg Hx   . Esophageal cancer Neg Hx   . Colon cancer Neg Hx     CURRENT MEDICATIONS:  Outpatient Encounter Medications as of 03/17/2019  Medication Sig  . Artificial Tear Ointment (DRY EYES OP) Place 1 drop into both eyes every morning.  Marland Kitchen atorvastatin (LIPITOR) 20 MG tablet   . Calcium Polycarbophil (FIBER-CAPS PO) Take 1 capsule by mouth daily.  . cetirizine (ZYRTEC) 10 MG tablet Take 10 mg by mouth daily.   . Cholecalciferol (VITAMIN D) 2000 units CAPS Take 2,000 Units by mouth daily.  . diazepam (VALIUM) 5 MG tablet Take 5 mg by mouth daily.   Marland Kitchen docusate sodium (COLACE) 100 MG capsule Take  100 mg by mouth daily.  . Melatonin 5 MG TABS Take 5 mg by mouth daily.  . Multiple Vitamins-Minerals (CENTRUM SILVER 50+MEN PO) Take by mouth.  . Omega-3 Fatty Acids (FISH OIL PO) Take 1 capsule by mouth daily.   . tamsulosin (FLOMAX) 0.4 MG CAPS capsule TAKE 1 CAPSULE BY MOUTH DAILY  . [DISCONTINUED] Melatonin 5 MG CAPS Take by mouth.   No facility-administered encounter medications on file as of 03/17/2019.     ALLERGIES:  Allergies  Allergen Reactions  . Naproxen Sodium Other (See  Comments)    Weak legs and extreme fatigue, all he could do was sit in a chair  . Silodosin Other (See Comments)    Eyes watery and crust all around the eyes     PHYSICAL EXAM:  ECOG Performance status: 1  Vitals:   03/17/19 1015  BP: (!) 120/59  Pulse: (!) 57  Resp: 14  Temp: 97.6 F (36.4 C)  SpO2: 100%   Filed Weights   03/17/19 1015  Weight: 146 lb 14.4 oz (66.6 kg)    Physical Exam Constitutional:      Appearance: Normal appearance. He is normal weight.  Neck:     Musculoskeletal: Normal range of motion and neck supple.  Cardiovascular:     Rate and Rhythm: Normal rate and regular rhythm.     Heart sounds: Normal heart sounds.  Pulmonary:     Effort: Pulmonary effort is normal.     Breath sounds: Normal breath sounds.  Abdominal:     General: Bowel sounds are normal.     Palpations: Abdomen is soft.  Musculoskeletal: Normal range of motion.  Skin:    General: Skin is warm and dry.  Neurological:     Mental Status: He is alert and oriented to person, place, and time. Mental status is at baseline.  Psychiatric:        Mood and Affect: Mood normal.        Behavior: Behavior normal.        Thought Content: Thought content normal.        Judgment: Judgment normal.      LABORATORY DATA:  I have reviewed the labs as listed.  CBC    Component Value Date/Time   WBC 5.1 03/10/2019 1114   RBC 4.84 03/10/2019 1114   HGB 16.2 03/10/2019 1114   HCT 48.3 03/10/2019 1114    PLT 126 (L) 03/10/2019 1114   MCV 99.8 03/10/2019 1114   MCH 33.5 03/10/2019 1114   MCHC 33.5 03/10/2019 1114   RDW 12.8 03/10/2019 1114   LYMPHSABS 1.5 03/10/2019 1114   MONOABS 0.4 03/10/2019 1114   EOSABS 0.1 03/10/2019 1114   BASOSABS 0.0 03/10/2019 1114   CMP Latest Ref Rng & Units 03/10/2019 02/05/2018 08/14/2017  Glucose 70 - 99 mg/dL 114(H) 88 91  BUN 8 - 23 mg/dL 21 21(H) 17  Creatinine 0.61 - 1.24 mg/dL 1.42(H) 1.37(H) 1.21  Sodium 135 - 145 mmol/L 138 141 137  Potassium 3.5 - 5.1 mmol/L 4.9 5.3(H) 4.4  Chloride 98 - 111 mmol/L 104 105 105  CO2 22 - 32 mmol/L 26 26 25   Calcium 8.9 - 10.3 mg/dL 9.6 10.0 9.4  Total Protein 6.5 - 8.1 g/dL 7.1 7.2 6.8  Total Bilirubin 0.3 - 1.2 mg/dL 0.9 1.2 0.9  Alkaline Phos 38 - 126 U/L 69 63 68  AST 15 - 41 U/L 29 31 24   ALT 0 - 44 U/L 29 29 22      I personally performed a face-to-face visit.  All questions were answered to patient's stated satisfaction. Encouraged patient to call with any new concerns or questions before his next visit to the cancer center and we can certain see him sooner, if needed.     ASSESSMENT & PLAN:   Non-Hodgkin lymphoma ([place]) 1.  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 - Patient had a needle biopsy of the paraspinous mass on 03/13/2010. -  At the time of diagnosis he had a 6.5 cm mass in the paraspinous area. -PET CT scan was done which showed no evidence of metastatic disease. -Patient is now 9 years out from his diagnosis and treatment. -He is asymptomatic.  Denies any B symptoms. - We will only do scans if physical assessment indicates. -Labs on 03/10/2019 showed hemoglobin of 16.2, WBCs of 5.1, platelets 126, potassium 4.9, creatinine 1.42. -She will follow-up in 1 year with repeat labs.  2.  Renal insufficiency: - Labs on 03/10/2019 showed his creatinine stable at 1.42. - He has been  referred to nephrology several times but wishes to see his PCP at this time.  3.  Thrombocytopenia: - Labs on 03/10/2019 showed his platelets at 126. -This is stable with labs dating back to 2018 when his platelet count was 124. -We will continue to monitor.      Orders placed this encounter:  Orders Placed This Encounter  Procedures  . Beta 2 microglobulin, serum  . Lactate dehydrogenase  . CBC with Differential/Platelet  . Comprehensive metabolic panel      Francene Finders, FNP-C [place] [phone]

## 2019-03-17 NOTE — Assessment & Plan Note (Signed)
1.  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 - Patient had a needle biopsy of the paraspinous mass on 03/13/2010. - At the time of diagnosis he had a 6.5 cm mass in the paraspinous area. -PET CT scan was done which showed no evidence of metastatic disease. -Patient is now 9 years out from his diagnosis and treatment. -He is asymptomatic.  Denies any B symptoms. - We will only do scans if physical assessment indicates. -Labs on 03/10/2019 showed hemoglobin of 16.2, WBCs of 5.1, platelets 126, potassium 4.9, creatinine 1.42. -She will follow-up in 1 year with repeat labs.  2.  Renal insufficiency: - Labs on 03/10/2019 showed his creatinine stable at 1.42. - He has been referred to nephrology several times but wishes to see his PCP at this time.  3.  Thrombocytopenia: - Labs on 03/10/2019 showed his platelets at 126. -This is stable with labs dating back to 2018 when his platelet count was 124. -We will continue to monitor.

## 2019-03-17 NOTE — Patient Instructions (Signed)
[place] at [facility] Discharge Instructions Follow up in 1 year with repeat labs   Thank you for choosing [place] at [facility] to provide your oncology and hematology care.  To afford each patient quality time with our provider, please arrive at least 15 minutes before your scheduled appointment time.   If you have a lab appointment with the [facility] please come in thru the  Main Entrance and check in at the main information desk  You need to re-schedule your appointment should you arrive 10 or more minutes late.  We strive to give you quality time with our providers, and arriving late affects you and other patients whose appointments are after yours.  Also, if you no show three or more times for appointments you may be dismissed from the clinic at the providers discretion.     Again, thank you for choosing [facility].  Our hope is that these requests will decrease the amount of time that you wait before being seen by our physicians.       _____________________________________________________________  Should you have questions after your visit to [facility], please contact our office at [phone] between the hours of 8:00 a.m. and 4:30 p.m.  Voicemails left after 4:00 p.m. will not be returned until the following business day.  For prescription refill requests, have your pharmacy contact our office and allow 72 hours.    Cancer Center Support Programs:   > Cancer Support Group  2nd Tuesday of the month 1pm-2pm, Journey Room

## 2019-05-01 DIAGNOSIS — N138 Other obstructive and reflux uropathy: Secondary | ICD-10-CM | POA: Diagnosis not present

## 2019-05-01 DIAGNOSIS — N401 Enlarged prostate with lower urinary tract symptoms: Secondary | ICD-10-CM | POA: Diagnosis not present

## 2019-05-01 DIAGNOSIS — R972 Elevated prostate specific antigen [PSA]: Secondary | ICD-10-CM | POA: Diagnosis not present

## 2019-05-25 DIAGNOSIS — G2581 Restless legs syndrome: Secondary | ICD-10-CM | POA: Diagnosis not present

## 2019-05-25 DIAGNOSIS — L2081 Atopic neurodermatitis: Secondary | ICD-10-CM | POA: Diagnosis not present

## 2019-05-25 DIAGNOSIS — N401 Enlarged prostate with lower urinary tract symptoms: Secondary | ICD-10-CM | POA: Diagnosis not present

## 2019-05-25 DIAGNOSIS — C8580 Other specified types of non-Hodgkin lymphoma, unspecified site: Secondary | ICD-10-CM | POA: Diagnosis not present

## 2019-05-25 DIAGNOSIS — Z0000 Encounter for general adult medical examination without abnormal findings: Secondary | ICD-10-CM | POA: Diagnosis not present

## 2019-05-25 DIAGNOSIS — I10 Essential (primary) hypertension: Secondary | ICD-10-CM | POA: Diagnosis not present

## 2019-06-12 ENCOUNTER — Other Ambulatory Visit: Payer: Self-pay

## 2019-07-16 DIAGNOSIS — I872 Venous insufficiency (chronic) (peripheral): Secondary | ICD-10-CM | POA: Diagnosis not present

## 2019-08-03 DIAGNOSIS — Z23 Encounter for immunization: Secondary | ICD-10-CM | POA: Diagnosis not present

## 2019-08-11 ENCOUNTER — Other Ambulatory Visit (HOSPITAL_COMMUNITY): Payer: Self-pay | Admitting: Pulmonary Disease

## 2019-08-11 DIAGNOSIS — C8580 Other specified types of non-Hodgkin lymphoma, unspecified site: Secondary | ICD-10-CM | POA: Diagnosis not present

## 2019-08-11 DIAGNOSIS — R609 Edema, unspecified: Secondary | ICD-10-CM

## 2019-08-11 DIAGNOSIS — I129 Hypertensive chronic kidney disease with stage 1 through stage 4 chronic kidney disease, or unspecified chronic kidney disease: Secondary | ICD-10-CM | POA: Diagnosis not present

## 2019-08-11 DIAGNOSIS — N183 Chronic kidney disease, stage 3 (moderate): Secondary | ICD-10-CM | POA: Diagnosis not present

## 2019-08-11 DIAGNOSIS — D696 Thrombocytopenia, unspecified: Secondary | ICD-10-CM | POA: Diagnosis not present

## 2019-08-25 ENCOUNTER — Ambulatory Visit (HOSPITAL_COMMUNITY)
Admission: RE | Admit: 2019-08-25 | Discharge: 2019-08-25 | Disposition: A | Discharge status: Home / Self Care | Payer: Medicare Other | Source: Ambulatory Visit | Attending: Pulmonary Disease | Admitting: Pulmonary Disease

## 2019-08-25 ENCOUNTER — Other Ambulatory Visit: Payer: Self-pay

## 2019-08-25 DIAGNOSIS — R609 Edema, unspecified: Secondary | ICD-10-CM

## 2019-08-25 DIAGNOSIS — I080 Rheumatic disorders of both mitral and aortic valves: Secondary | ICD-10-CM | POA: Diagnosis not present

## 2019-08-25 NOTE — Progress Notes (Signed)
*  PRELIMINARY RESULTS* Echocardiogram 2D Echocardiogram has been performed.  Arthur Walls 08/25/2019, 3:10 PM

## 2019-12-14 DEATH — deceased

## 2019-12-17 ENCOUNTER — Ambulatory Visit (INDEPENDENT_AMBULATORY_CARE_PROVIDER_SITE_OTHER): Payer: Medicare Other | Admitting: Gastroenterology

## 2020-03-10 ENCOUNTER — Other Ambulatory Visit (HOSPITAL_COMMUNITY): Payer: Medicare Other

## 2020-03-17 ENCOUNTER — Ambulatory Visit (HOSPITAL_COMMUNITY): Payer: Medicare Other | Admitting: Nurse Practitioner
